# Patient Record
Sex: Female | Born: 1965 | Race: White | Hispanic: No | Marital: Married | Smoking: Former smoker
Health system: Southern US, Community
[De-identification: ages and names within clinical notes are randomized; demographics above are authoritative.]

## PROBLEM LIST (undated history)

## (undated) DIAGNOSIS — D649 Anemia, unspecified: Secondary | ICD-10-CM

---

## 1998-02-26 ENCOUNTER — Other Ambulatory Visit: Admission: RE | Admit: 1998-02-26 | Discharge: 1998-02-26 | Payer: Self-pay | Admitting: Obstetrics and Gynecology

## 1998-07-07 ENCOUNTER — Ambulatory Visit (HOSPITAL_COMMUNITY): Admission: AD | Admit: 1998-07-07 | Discharge: 1998-07-07 | Payer: Self-pay | Admitting: Unknown Physician Specialty

## 1999-03-04 ENCOUNTER — Other Ambulatory Visit: Admission: RE | Admit: 1999-03-04 | Discharge: 1999-03-04 | Payer: Self-pay | Admitting: Obstetrics and Gynecology

## 1999-12-03 ENCOUNTER — Inpatient Hospital Stay (HOSPITAL_COMMUNITY): Admission: AD | Admit: 1999-12-03 | Discharge: 1999-12-03 | Payer: Self-pay | Admitting: Obstetrics and Gynecology

## 1999-12-24 ENCOUNTER — Inpatient Hospital Stay (HOSPITAL_COMMUNITY): Admission: AD | Admit: 1999-12-24 | Discharge: 1999-12-24 | Payer: Self-pay | Admitting: *Deleted

## 1999-12-31 ENCOUNTER — Inpatient Hospital Stay (HOSPITAL_COMMUNITY): Admission: AD | Admit: 1999-12-31 | Discharge: 2000-01-03 | Payer: Self-pay | Admitting: Obstetrics and Gynecology

## 2000-02-10 ENCOUNTER — Other Ambulatory Visit: Admission: RE | Admit: 2000-02-10 | Discharge: 2000-02-10 | Payer: Self-pay | Admitting: Obstetrics and Gynecology

## 2001-05-12 ENCOUNTER — Other Ambulatory Visit: Admission: RE | Admit: 2001-05-12 | Discharge: 2001-05-12 | Payer: Self-pay | Admitting: Obstetrics and Gynecology

## 2002-04-25 ENCOUNTER — Other Ambulatory Visit: Admission: RE | Admit: 2002-04-25 | Discharge: 2002-04-25 | Payer: Self-pay | Admitting: Obstetrics and Gynecology

## 2003-08-24 ENCOUNTER — Other Ambulatory Visit: Admission: RE | Admit: 2003-08-24 | Discharge: 2003-08-24 | Payer: Self-pay | Admitting: Obstetrics and Gynecology

## 2004-10-24 ENCOUNTER — Other Ambulatory Visit: Admission: RE | Admit: 2004-10-24 | Discharge: 2004-10-24 | Payer: Self-pay | Admitting: Obstetrics and Gynecology

## 2006-06-03 ENCOUNTER — Encounter: Admission: RE | Admit: 2006-06-03 | Discharge: 2006-06-03 | Payer: Self-pay | Admitting: Obstetrics and Gynecology

## 2006-06-03 ENCOUNTER — Encounter (INDEPENDENT_AMBULATORY_CARE_PROVIDER_SITE_OTHER): Payer: Self-pay | Admitting: *Deleted

## 2007-02-04 HISTORY — PX: ABDOMINAL HYSTERECTOMY: SHX81

## 2007-07-07 ENCOUNTER — Ambulatory Visit (HOSPITAL_COMMUNITY): Admission: RE | Admit: 2007-07-07 | Discharge: 2007-07-08 | Payer: Self-pay | Admitting: Obstetrics and Gynecology

## 2007-07-07 ENCOUNTER — Encounter (INDEPENDENT_AMBULATORY_CARE_PROVIDER_SITE_OTHER): Payer: Self-pay | Admitting: Obstetrics and Gynecology

## 2010-06-18 NOTE — Discharge Summary (Signed)
[name], [name]                ACCOUNT NO.:  [id number]   MEDICAL RECORD NO.:  [id number]          PATIENT TYPE:  OIB   LOCATION:  9302                          FACILITY:  [facility]   PHYSICIAN:  Duke Salvia. Marcelle Overlie, M.D.DATE OF BIRTH:  [birth year]   DATE OF ADMISSION:  07/07/2007  DATE OF DISCHARGE:                               DISCHARGE SUMMARY   DISCHARGE DIAGNOSES:  1. Pelvic pain, dysmenorrhea.  2. History of pelvic endometriosis.  3. Uterine prolapse.  4. Laparoscopic-assisted vaginal hysterectomy, lysis of adhesions,      left salpingo-oophorectomy this admission.   SUMMARY OF THE HISTORY AND PHYSICAL EXAMINATION:  Please see admission H  and P for details.  Briefly, a 45 year old, G3, P2, A1, 2 previous  cesarean sections who has a history of dysmenorrhea and chronic pelvic  pain, minimal endometriosis by history, and also complains of increased  pelvic pressure consistent with uterine prolapse who presents for  surgical correction.   HOSPITAL COURSE:  On July 07, 2007, under general anesthesia, the patient  underwent LAVH, lysis of adhesions, dense left adnexal adhesions were  noted, and LSO was carried out also.  The right ovary was conserved.  On  the first postoperative day, her hemoglobin was 9.3, WBC 9.9.  She was  afebrile.  Her abdominal exam was unremarkable.  She was tolerating a  regular diet, voiding without difficulty, and ready for discharge at  that point.   LABORATORY DATA:  Preop CBC; WBC 7.7, hemoglobin 12.9, and hematocrit  38.0.  Blood type was O+.  Antibody screen negative.  UPT was negative.  Postop CBC on July 08, 2007; WBC 9.9, hemoglobin 9.3, and hematocrit  26.8.   DISPOSITION:  The patient discharged on vitamins with iron daily, Tylox  p.r.n. pain.  Return to the office in 1 week.  Advised to report any  incisional redness or drainage, fever over 101, increased pain or  voiding difficulties.  She was given specific instructions regarding  diet,  sex, and exercise.   CONDITION:  Good.   ACTIVITY:  Graded increase.      Richard M. Marcelle Overlie, M.D.  Electronically Signed     RMH/MEDQ  D:  07/08/2007  T:  07/08/2007  Job:  [id number]

## 2010-06-18 NOTE — H&P (Signed)
NAMELAVANDA, [name]                ACCOUNT NO.:  [id number]   MEDICAL RECORD NO.:  [id number]          PATIENT TYPE:  AMB   LOCATION:  SDC                           [facility]  [facility]   PHYSICIAN:  Duke Salvia. Marcelle Overlie, M.D.DATE OF BIRTH:  05/17/1965   DATE OF ADMISSION:  07/07/2007  DATE OF DISCHARGE:                              HISTORY & PHYSICAL   CHIEF COMPLAINT:  Chronic pelvic pain, dysmenorrhea, uterine prolapse  symptoms.   HISTORY OF PRESENT ILLNESS:  A 45 year old G3, P2, A1.  She has had two  previous cesarean sections, currently using condoms for contraception.  She has a 4-5-year history of dysmenorrhea and chronic pelvic pain, in  the early 1990s had a laparoscopy and was told she had minimal  endometriosis.  That was not done here in [place].  Since that time  she has had two pregnancies at term delivered by cesarean section but  has continued to be bothered by dysmenorrhea, chronic pelvic pain that  has been relieved somewhat with OCPs.  She is currently not on OCPs and  more recently in the last 6 months has noted increased pressure with a  sensation that something is hanging out, consistent with uterine  prolapse on examination.  Due to these reasons she presents now for a  definitive hysterectomy.  Procedure of LAVH reviewed in detail with her.  Specifically, risks related to bleeding, infection, transfusion,  adjacent organ injury all discussed.  Also discussed the possibility of  the need for open surgery depending on pelvic pathology.  Her preference  is to conserve both ovaries if normal.  Expected recovery time along  with other risks related to wound infection and phlebitis discussed with  her, which she understands and accepts.   PAST MEDICAL HISTORY:  Allergies:  None.   Current medications:  None.   Surgical history:  1. Cesarean section x2.  2. Liposuction.  3. Laparoscopy.  4. Benign breast biopsy.   FAMILY HISTORY:  Significant for family history  of colon cancer.  She  has had a screening colonoscopy within the last several years that was  normal.   PHYSICAL EXAM:  Temperature 98.2, blood pressure 120/78.  HEENT: Unremarkable.  NECK:  Supple without masses.  LUNGS:  Clear.  CARDIOVASCULAR:  Regular rate and rhythm without murmurs, rubs or  gallops noted.  BREASTS:  Without masses.  ABDOMEN:  Soft, flat and nontender.  PELVIC:  Normal external genitalia.  Vagina and cervix clear.  Uterus  midposition, normal size.  On straining there is a mild to moderate  prolapse.  Her anterior and posterior support is good.  Adnexa  unremarkable.  No unusual nodularity or tenderness.  EXTREMITIES:  Unremarkable.  NEUROLOGIC:  Unremarkable.   IMPRESSION:  1. One symptomatic uterine prolapse.  2. History of mild endometriosis.  3. Dysmenorrhea, chronic pelvic pain.   PLAN:  LAVH.  Procedure and risks reviewed as above up.      Richard M. Marcelle Overlie, M.D.  Electronically Signed     RMH/MEDQ  D:  06/24/2007  T:  06/24/2007  Job:  [id number]

## 2010-06-18 NOTE — Op Note (Signed)
[name], [name]                ACCOUNT NO.:  [id number]   MEDICAL RECORD NO.:  [id number]          PATIENT TYPE:  OIB   LOCATION:  9302                          [facility]ILITY:  [facility]   PHYSICIAN:  Duke Salvia. Marcelle Overlie, M.D.DATE OF BIRTH:  [birth year]   DATE OF PROCEDURE:  07/07/2007  DATE OF DISCHARGE:                               OPERATIVE REPORT   PREOPERATIVE DIAGNOSES:  Chronic pelvic pain and dysmenorrhea.   POSTOPERATIVE DIAGNOSIS:  Chronic pelvic pain and dysmenorrhea plus  significant pelvic adhesions.   PROCEDURES:  Laparoscopic-assisted vaginal hysteroscopy, left salpingo-  oophorectomy, and lysis of adhesions.   SURGEON:  Duke Salvia. Marcelle Overlie, MD   ASSISTANT:  Dineen Kid. Rana Snare, MD   ANESTHESIA:  General endotracheal.   SPECIMENS REMOVED:  Uterus and left tube and ovary including left  ovarian fibroma.   ESTIMATED BLOOD LOSS:  300 mL.   PROCEDURE AND FINDINGS:  The patient was taken to the operating room  after an adequate level of general anesthesia was obtained with the  patient's legs in stirrups.  The abdomen, perineum, and vagina were  prepped and draped in usual manner for laparoscopy.  Hulka tenaculum was  positioned.  Foley catheter was positioned draining clear urine.  The  subumbilical area was infiltrated with 0.25% Marcaine plain.  A small  incision was made.  The Veress needle was introduced without difficulty.  Its intra-abdominal position was verified by pressure and water testing.  After 2.5 liter, pneumoperitoneum was then created.  Laparoscopic trocar  and sleeve were then introduced without difficulty.  Three  fingerbreadths above the symphysis in the midline, a 5-mm trocar was  inserted under direct visualization.  Pelvic findings as follows:   The uterus itself was normal size.  Right tube and ovary were normal.  There were dense adhesions from the left lateral anterior uterine wall  at the bladder reflection to the anterior abdominal wall.  There  was  some omentum stuck into that area also.  The left ovary had a 2-cm  apparent ovarian fibroma on 1 pole and was on traction into the area of  the round ligament.  Starting with the omental adhesions, these areas  were lysed in an avascular plane, which assisted in better  visualization.  The right utero-ovarian pedicle was then coagulated and  divided down to and including the round ligament with excellent  hemostasis.  On the opposite side, the dense band of adhesions were  lysed close to the uterus with the Gyrus PK instrument down to the level  of the bladder reflection.  The left utero-ovarian pedicle was then  coagulated and divided with the PK instrument.  Initially, I was going  to attempt to remove the fibroma vaginally and conserved the left ovary,  so the vaginal portion procedure started at this stage.   Weighted speculum was positioned.  Her legs were extended.  The cervical  vaginal mucosa was incised.  Posterior culdotomy was performed without  difficulty.  Bladder was advanced superiorly with sharp and blunt  dissection using the handheld Gyrus PK instrument.  The uterosacral  ligament, cardinal  ligament, and uterine vasculature pedicles were  coagulated and divided on each side.  There was some significant  scarring anteriorly and the surgeon's finger was used then used  posteriorly to isolate the peritoneal reflection and this was entered  sharply.  Retractor was then used to gently elevate the bladder out of  the field for the remainder of the case.  The remaining pedicles were  then coagulated and divided with the handheld instrument.  Specimen was  thus removed.  I could not isolate the left ovary and was on too much  traction to be pulled down to the vagina.  The McCall culdoplasty was  placed from left uterosacral ligament, picking up posterior peritoneum  across to the right uterosacral ligament which was tied down for extra  posterior support.  The cuff was  then closed with a 2-0 Vicryl locked  suture.  Prior to closure, sponge, needle, and instrument counts were  reported as correct x2.  The vaginal mucosa was closed right-to-left  with 2-0 Monocryl sutures.  Foley catheter was repositioned draining  clear urine at that point.  Repeat laparoscopy was carried out with a  NaSal for irrigation.  The area of the cuff was on the opposite site was  hemostatic even at reduced pressure.   The left ovary, however, was tented up into the round ligament and  appeared to be in a good size and appeared to be problematic as far as  causing pain later.  I did not feel this could be freed up.  Decision  was made to proceed with LSO.  The ovary was placed on traction toward  the midline.  The left IP ligament was visualized.  The course of the  ureter was inspected first and noted to be well below, staying close to  the ovary.  The infundibulopelvic ligament was coagulated and divided.  The left tube and ovary was freed out.  Re-inspection of the ureter  course revealed it to be well below.  A 5-mm trocar was inserted in the  left lower quadrant and the fascia was enlarged slightly to allow  removal of the ovary and the fibroma.  After this was completed, copious  irrigation was carried out and inspection revealed the operative sites  be hemostatic.  Instruments were removed.  Gas was allowed to escape.  The left lower quadrant incision fascia was closed with a 2-0 Vicryl  running suture and 4-0 Vicryl subcuticular.  The subumbilical was closed  with a 4-0 Vicryl subcuticular, the remaining incision with Dermabond.  She tolerated this well and went to recovery room in good condition.   ADDENDUM:  The bladder was backfilled under pressure with sterile milk  prior to closure and was noted to be intact.      Richard M. Marcelle Overlie, M.D.  Electronically Signed     RMH/MEDQ  D:  07/07/2007  T:  07/08/2007  Job:  [id number]

## 2010-06-21 NOTE — Discharge Summary (Signed)
[facility]  Patient:    [name], [name]                         MRN: [id number] Adm. Date:  [id number] Disc. Date: 01/03/00 Attending:  Rhina Brackett Dictator:   Danie Chandler, R.N.                           Discharge Summary  ADMISSION DIAGNOSES:          1. Term intrauterine pregnancy.                               2. Previous cesarean section.                               3. Declines vaginal birth after cesarean                                  section.  DISCHARGE DIAGNOSES:          1. Term intrauterine pregnancy.                               2. Previous cesarean section.                               3. Declines vaginal birth after cesarean                                  section.  PROCEDURES:                   On December 31, 1999, repeat low transverse cesarean section.  REASON FOR ADMISSION:         Please see the dictated H&P.  HOSPITAL COURSE:              The patient was taken to the operating room and underwent the above-named procedure without complication.  This was productive of a viable female infant with Apgars of 9 at one minute and 9 at five minutes. Postoperatively on day #1, the patient had a good return of bowel function and was tolerating a regular diet.  She also had good pain control and was ambulating well without difficulty.  Her hemoglobin on this day was 10.7, hematocrit 30.5, and white blood cell count 10.5.  On postoperative day #2, the patient was stable and she was discharged home on postoperative day #3.  CONDITION ON DISCHARGE:       Good.  DIET:                         Regular as tolerated.  ACTIVITY:                     No heavy lifting.  No driving.  No vaginal entry.  FOLLOW-UP:                    She is to follow up in the office in one to two weeks for incision check.  She is  to call for temperature greater than 100 degrees, persistent nausea, vomiting, heavy vaginal bleeding, and/or redness  from the incision site.  DISCHARGE MEDICATIONS:        1. Prenatal vitamins one p.o. q.d.                               2. Percocet 5 mg as directed by M.D. for pain. DD:  01/03/00 TD:  01/03/00 Job: [id number]

## 2010-06-21 NOTE — Op Note (Signed)
[facility]  Patient:    [name], [name]                         MRN: [id number] Proc. Date: 12/31/99 Adm. Date:  [id number] Attending:  Rhina Brackett                           Operative Report  PREOPERATIVE DIAGNOSES:       1. Term intrauterine pregnancy.                               2. Previous cesarean section.                               3. Declines vaginal birth after cesarean.  POSTOPERATIVE DIAGNOSES:      1. Term intrauterine pregnancy.                               2. Previous cesarean section.                               3. Declines vaginal birth after cesarean.  OPERATION:                    Repeat low transverse cesarean section.  SURGEON:                      Duke Salvia. Marcelle Overlie, M.D.  ASSISTANT:  ANESTHESIA:                   Spinal anesthesia.  COMPLICATIONS:                None.  DRAINS:                       Foley catheter.  ESTIMATED BLOOD LOSS:         1000 cc.  DESCRIPTION OF PROCEDURE:     The patient was taken to the operating room and after an adequate level of spinal anesthetic was obtained with the patient in the left tilt position, the abdomen was prepped and draped in the usual manner for sterile abdominal procedures.  Foley catheter positioned draining clear urine.  Attention was directed to the abdomen where the prior skin scar was incised in an ellipse, carried down to the fascia which was incised and extended transversely.  There was a moderate amount of dense scar tissue noted in the fascial and subfascial layers.  The rectus muscles were separated from the fascia, divided in the midline superiorly and the peritoneum entered without difficulty.  There was significant bladder flap scarring noted. With careful dissection, the rectus muscles were partially divided and the peritoneal incision was enlarged for better visibility.  The bladder blade was positioned and the bladder flap was developed with  sharp and blunt dissection without difficulty.  Incision was made in the lower segment which was extended with blunt dissection. Clear fluid noted.  The patient delivered of a 9 pound 6 ounce female with Apgars 9 and 9. The infant was suctioned, cord clamped and passed to the pediatric team for further care. A tender touch VE was used to easily deliver  the vertex and the remainder of the fetus delivered easily. Placenta then delivered manually intact.  The uterus exteriorized. The cavity wiped clean with a laparotomy pack.  Closure obtained with the first layer of 0 chromic in locked fashion followed by an imbricating layer of 0 chromic. Tubes and ovaries were inspected and noted to be normal.  The bladder area was inspected carefully and noted to be intact and hemostatic.  Prior to closure, sponge, needle and instrument counts were reported as correct x 2.  Rectus muscles reapproximated in the lower portion at least with 2-0 Dexon interrupted sutures.  Fascia closed from laterally to midline on either side with 0 Dexon running suture.  Subcutaneous fat was undermined to allow better closure of the skin without tension. This area was made hemostatic with the Bovie, irrigated and reinspected and noted to be hemostatic.  Skin was approximated with clips and Steri-Strips.  Pressure dressing was applied.  She did receive Pitocin IV and Ancef 1 g IV after the cord was clamped. DD:  12/31/99 TD:  12/31/99 Job: [id number]

## 2010-10-31 LAB — TYPE AND SCREEN: Antibody Screen: NEGATIVE

## 2010-10-31 LAB — CBC
Hemoglobin: 12.9
MCHC: 34.6
MCV: 86.9
Platelets: 209
Platelets: 292
RDW: 13.5
RDW: 13.9
WBC: 7.7

## 2010-10-31 LAB — PREGNANCY, URINE

## 2012-03-23 ENCOUNTER — Other Ambulatory Visit: Payer: Self-pay | Admitting: Orthopedic Surgery

## 2012-04-01 ENCOUNTER — Encounter (HOSPITAL_COMMUNITY): Payer: Self-pay | Admitting: Pharmacy Technician

## 2012-04-05 ENCOUNTER — Inpatient Hospital Stay (HOSPITAL_COMMUNITY): Admission: RE | Admit: 2012-04-05 | Payer: Self-pay | Source: Ambulatory Visit

## 2012-04-05 ENCOUNTER — Other Ambulatory Visit (HOSPITAL_COMMUNITY): Payer: Self-pay | Admitting: Specialist

## 2012-04-05 NOTE — Patient Instructions (Addendum)
20 [name] [id number]  04/05/2012   Your procedure is scheduled on: 04-14-2012  Report to [facility] at 0630 AM.  Call this number if you have problems the morning of surgery [phone]   Remember:   Do not eat food or drink liquids :After Midnight.     Take these medicines the morning of surgery with A SIP OF WATER: no meds to take                                SEE [place] PREPARING FOR SURGERY SHEET   Do not wear jewelry, make-up or nail polish.  Do not wear lotions, powders, or perfumes. You may wear deodorant.   Men may shave face and neck.  Do not bring valuables to the hospital.  Contacts, dentures or bridgework may not be worn into surgery.  Leave suitcase in the car. After surgery it may be brought to your room.  For patients admitted to the hospital, checkout time is 11:00 AM the day of discharge.   Patients discharged the day of surgery will not be allowed to drive home.  Name and phone number of your driver:  Special Instructions: N/A   Please read over the following fact sheets that you were given: MRSA Information.  Call Cain Sieve RN pre op nurse if needed [phone]    FAILURE TO FOLLOW THESE INSTRUCTIONS MAY RESULT IN THE CANCELLATION OF YOUR SURGERY. PATIENT SIGNATURE___________________________________________

## 2012-04-06 ENCOUNTER — Other Ambulatory Visit: Payer: Self-pay | Admitting: Orthopedic Surgery

## 2012-04-06 ENCOUNTER — Encounter (HOSPITAL_COMMUNITY): Payer: Self-pay

## 2012-04-06 ENCOUNTER — Ambulatory Visit (HOSPITAL_COMMUNITY)
Admission: RE | Admit: 2012-04-06 | Discharge: 2012-04-06 | Disposition: A | Discharge status: Home / Self Care | Payer: 59 | Source: Ambulatory Visit | Attending: Orthopedic Surgery | Admitting: Orthopedic Surgery

## 2012-04-06 ENCOUNTER — Encounter (HOSPITAL_COMMUNITY)
Admission: RE | Admit: 2012-04-06 | Discharge: 2012-04-06 | Disposition: A | Discharge status: Home / Self Care | Payer: 59 | Source: Ambulatory Visit | Attending: Specialist | Admitting: Specialist

## 2012-04-06 DIAGNOSIS — M545 Low back pain, unspecified: Secondary | ICD-10-CM | POA: Insufficient documentation

## 2012-04-06 DIAGNOSIS — Z01812 Encounter for preprocedural laboratory examination: Secondary | ICD-10-CM | POA: Insufficient documentation

## 2012-04-06 DIAGNOSIS — Z01818 Encounter for other preprocedural examination: Secondary | ICD-10-CM | POA: Insufficient documentation

## 2012-04-06 HISTORY — DX: Anemia, unspecified: D64.9

## 2012-04-06 LAB — CBC
HCT: 40.2 % (ref 36.0–46.0)
Hemoglobin: 14 g/dL (ref 12.0–15.0)
MCH: 30.8 pg (ref 26.0–34.0)
MCHC: 34.8 g/dL (ref 30.0–36.0)
MCV: 88.5 fL (ref 78.0–100.0)
Platelets: 249 K/uL (ref 150–400)
RBC: 4.54 MIL/uL (ref 3.87–5.11)
RDW: 12.6 % (ref 11.5–15.5)
WBC: 6.4 K/uL (ref 4.0–10.5)

## 2012-04-06 LAB — BASIC METABOLIC PANEL
CO2: 25 mEq/L (ref 19–32)
Calcium: 9.2 mg/dL (ref 8.4–10.5)
Creatinine, Ser: 0.71 mg/dL (ref 0.50–1.10)
GFR calc Af Amer: >90 mL/min (ref >90)

## 2012-04-06 LAB — SURGICAL PCR SCREEN
MRSA, PCR: NEGATIVE
Staphylococcus aureus: NEGATIVE

## 2012-04-06 NOTE — Progress Notes (Signed)
Spoke with pt by phone aware surgery date changed to 04-12-12, no food after midnight clear liquids midnight until 0835 am day of surgery, then nothing by mouth after 0835 am. Arrive 1205 pm 04-12-12 [facility] short stay

## 2012-04-12 ENCOUNTER — Ambulatory Visit (HOSPITAL_COMMUNITY): Payer: 59

## 2012-04-12 ENCOUNTER — Encounter (HOSPITAL_COMMUNITY)
Admission: RE | Disposition: A | Discharge status: Home / Self Care | Payer: Self-pay | Source: Ambulatory Visit | Attending: Specialist

## 2012-04-12 ENCOUNTER — Observation Stay (HOSPITAL_COMMUNITY)
Admission: RE | Admit: 2012-04-12 | Discharge: 2012-04-13 | Disposition: A | Discharge status: Home / Self Care | Payer: 59 | Source: Ambulatory Visit | Attending: Specialist | Admitting: Specialist

## 2012-04-12 ENCOUNTER — Encounter (HOSPITAL_COMMUNITY): Payer: Self-pay | Admitting: *Deleted

## 2012-04-12 ENCOUNTER — Encounter (HOSPITAL_COMMUNITY): Payer: Self-pay | Admitting: Anesthesiology

## 2012-04-12 ENCOUNTER — Ambulatory Visit (HOSPITAL_COMMUNITY): Payer: 59 | Admitting: Anesthesiology

## 2012-04-12 DIAGNOSIS — M5126 Other intervertebral disc displacement, lumbar region: Principal | ICD-10-CM | POA: Insufficient documentation

## 2012-04-12 HISTORY — PX: LUMBAR LAMINECTOMY/DECOMPRESSION MICRODISCECTOMY: SHX5026

## 2012-04-12 SURGERY — LUMBAR LAMINECTOMY/DECOMPRESSION MICRODISCECTOMY 1 LEVEL
Anesthesia: General | Site: Back | Wound class: Clean

## 2012-04-12 MED ORDER — DOCUSATE SODIUM 100 MG PO CAPS
100.0000 mg | ORAL_CAPSULE | Freq: Two times a day (BID) | ORAL | Status: DC
  Administered 2012-04-13: 100 mg via ORAL

## 2012-04-12 MED ORDER — LACTATED RINGERS IV SOLN: INTRAVENOUS | Status: DC

## 2012-04-12 MED ORDER — THROMBIN 5000 UNITS EX SOLR
CUTANEOUS | Status: DC | PRN
  Administered 2012-04-12: 10000 [IU] via TOPICAL

## 2012-04-12 MED ORDER — DIPHENHYDRAMINE HCL 50 MG/ML IJ SOLN
INTRAMUSCULAR | Status: AC
  Filled 2012-04-12: qty 1

## 2012-04-12 MED ORDER — METHOCARBAMOL 100 MG/ML IJ SOLN: 500.0000 mg | Freq: Four times a day (QID) | INTRAVENOUS | Status: DC | PRN

## 2012-04-12 MED ORDER — SENNOSIDES-DOCUSATE SODIUM 8.6-50 MG PO TABS
1.0000 | ORAL_TABLET | Freq: Every evening | ORAL | Status: DC | PRN
  Filled 2012-04-12: qty 1

## 2012-04-12 MED ORDER — SODIUM CHLORIDE 0.9 % IR SOLN
Status: DC | PRN
  Administered 2012-04-12: 16:00:00

## 2012-04-12 MED ORDER — SODIUM CHLORIDE 0.9 % IJ SOLN
3.0000 mL | INTRAMUSCULAR | Status: DC | PRN
  Administered 2012-04-12: 3 mL via INTRAVENOUS

## 2012-04-12 MED ORDER — MENTHOL 3 MG MT LOZG: 1.0000 | LOZENGE | OROMUCOSAL | Status: DC | PRN

## 2012-04-12 MED ORDER — SODIUM CHLORIDE 0.9 % IV SOLN
INTRAVENOUS | Status: DC
  Administered 2012-04-12: 20:00:00 via INTRAVENOUS

## 2012-04-12 MED ORDER — ACETAMINOPHEN 650 MG RE SUPP: 650.0000 mg | RECTAL | Status: DC | PRN

## 2012-04-12 MED ORDER — DIPHENHYDRAMINE HCL 50 MG/ML IJ SOLN
12.5000 mg | Freq: Once | INTRAMUSCULAR | Status: AC
  Administered 2012-04-12: 12.5 mg via INTRAVENOUS

## 2012-04-12 MED ORDER — CEFAZOLIN SODIUM-DEXTROSE 2-3 GM-% IV SOLR
2.0000 g | Freq: Three times a day (TID) | INTRAVENOUS | Status: AC
  Administered 2012-04-12 – 2012-04-13 (×2): 2 g via INTRAVENOUS
  Filled 2012-04-12 (×2): qty 50

## 2012-04-12 MED ORDER — CEFAZOLIN SODIUM-DEXTROSE 2-3 GM-% IV SOLR
INTRAVENOUS | Status: AC
  Filled 2012-04-12: qty 50

## 2012-04-12 MED ORDER — SODIUM CHLORIDE 0.9 % IV SOLN: 250.0000 mL | INTRAVENOUS | Status: DC

## 2012-04-12 MED ORDER — SODIUM CHLORIDE 0.9 % IJ SOLN
3.0000 mL | Freq: Two times a day (BID) | INTRAMUSCULAR | Status: DC
  Administered 2012-04-12: 3 mL via INTRAVENOUS

## 2012-04-12 MED ORDER — BISACODYL 5 MG PO TBEC: 5.0000 mg | DELAYED_RELEASE_TABLET | Freq: Every day | ORAL | Status: DC | PRN

## 2012-04-12 MED ORDER — FLEET ENEMA 7-19 GM/118ML RE ENEM: 1.0000 | ENEMA | Freq: Once | RECTAL | Status: AC | PRN

## 2012-04-12 MED ORDER — LACTATED RINGERS IV SOLN
INTRAVENOUS | Status: DC | PRN
  Administered 2012-04-12 (×2): via INTRAVENOUS

## 2012-04-12 MED ORDER — METHOCARBAMOL 500 MG PO TABS: 500.0000 mg | ORAL_TABLET | Freq: Four times a day (QID) | ORAL | Status: AC

## 2012-04-12 MED ORDER — MIDAZOLAM HCL 5 MG/5ML IJ SOLN: INTRAMUSCULAR | Status: DC | PRN

## 2012-04-12 MED ORDER — MIDAZOLAM HCL 5 MG/5ML IJ SOLN
INTRAMUSCULAR | Status: DC | PRN
  Administered 2012-04-12: 1 mg via INTRAVENOUS

## 2012-04-12 MED ORDER — HYDROMORPHONE HCL PF 1 MG/ML IJ SOLN
0.2500 mg | INTRAMUSCULAR | Status: DC | PRN
  Administered 2012-04-12: 0.5 mg via INTRAVENOUS

## 2012-04-12 MED ORDER — NEOSTIGMINE METHYLSULFATE 1 MG/ML IJ SOLN
INTRAMUSCULAR | Status: DC | PRN
  Administered 2012-04-12: 3 mg via INTRAVENOUS

## 2012-04-12 MED ORDER — PROMETHAZINE HCL 25 MG/ML IJ SOLN: 6.2500 mg | INTRAMUSCULAR | Status: DC | PRN

## 2012-04-12 MED ORDER — ONDANSETRON HCL 4 MG/2ML IJ SOLN: 4.0000 mg | INTRAMUSCULAR | Status: DC | PRN

## 2012-04-12 MED ORDER — MORPHINE SULFATE 2 MG/ML IJ SOLN
1.0000 mg | INTRAMUSCULAR | Status: DC | PRN
  Administered 2012-04-12 – 2012-04-13 (×2): 2 mg via INTRAVENOUS
  Filled 2012-04-12 (×2): qty 1

## 2012-04-12 MED ORDER — HYDROMORPHONE HCL PF 1 MG/ML IJ SOLN
INTRAMUSCULAR | Status: DC | PRN
  Administered 2012-04-12 (×4): 0.5 mg via INTRAVENOUS

## 2012-04-12 MED ORDER — ONDANSETRON HCL 4 MG/2ML IJ SOLN
INTRAMUSCULAR | Status: DC | PRN
  Administered 2012-04-12 (×2): 2 mg via INTRAVENOUS

## 2012-04-12 MED ORDER — ACETAMINOPHEN 10 MG/ML IV SOLN
INTRAVENOUS | Status: AC
  Filled 2012-04-12: qty 100

## 2012-04-12 MED ORDER — GLYCOPYRROLATE 0.2 MG/ML IJ SOLN
INTRAMUSCULAR | Status: DC | PRN
  Administered 2012-04-12: .4 mg via INTRAVENOUS

## 2012-04-12 MED ORDER — ALUM & MAG HYDROXIDE-SIMETH 200-200-20 MG/5ML PO SUSP: 30.0000 mL | Freq: Four times a day (QID) | ORAL | Status: DC | PRN

## 2012-04-12 MED ORDER — THROMBIN 5000 UNITS EX SOLR
CUTANEOUS | Status: AC
  Filled 2012-04-12: qty 5000

## 2012-04-12 MED ORDER — KETAMINE HCL 10 MG/ML IJ SOLN
INTRAMUSCULAR | Status: DC | PRN
  Administered 2012-04-12 (×4): 2 mg via INTRAVENOUS

## 2012-04-12 MED ORDER — ACETAMINOPHEN 325 MG PO TABS: 650.0000 mg | ORAL_TABLET | ORAL | Status: DC | PRN

## 2012-04-12 MED ORDER — BUPIVACAINE-EPINEPHRINE 0.5% -1:200000 IJ SOLN
INTRAMUSCULAR | Status: DC | PRN
  Administered 2012-04-12: 10 mL

## 2012-04-12 MED ORDER — ACETAMINOPHEN 10 MG/ML IV SOLN
INTRAVENOUS | Status: DC | PRN
  Administered 2012-04-12: 1000 mg via INTRAVENOUS

## 2012-04-12 MED ORDER — PHENOL 1.4 % MT LIQD: 1.0000 | OROMUCOSAL | Status: DC | PRN

## 2012-04-12 MED ORDER — CISATRACURIUM BESYLATE (PF) 10 MG/5ML IV SOLN
INTRAVENOUS | Status: DC | PRN
  Administered 2012-04-12: 2 mg via INTRAVENOUS
  Administered 2012-04-12 (×3): 1 mg via INTRAVENOUS
  Administered 2012-04-12: 5 mg via INTRAVENOUS

## 2012-04-12 MED ORDER — BUPIVACAINE-EPINEPHRINE (PF) 0.5% -1:200000 IJ SOLN
INTRAMUSCULAR | Status: AC
  Filled 2012-04-12: qty 10

## 2012-04-12 MED ORDER — ZOLPIDEM TARTRATE 5 MG PO TABS
5.0000 mg | ORAL_TABLET | Freq: Every evening | ORAL | Status: DC | PRN
  Administered 2012-04-12: 5 mg via ORAL
  Filled 2012-04-12: qty 1

## 2012-04-12 MED ORDER — LACTATED RINGERS IV SOLN
INTRAVENOUS | Status: DC
  Administered 2012-04-12 (×2): 1000 mL via INTRAVENOUS

## 2012-04-12 MED ORDER — CEFAZOLIN SODIUM-DEXTROSE 2-3 GM-% IV SOLR
2.0000 g | INTRAVENOUS | Status: AC
  Administered 2012-04-12: 2 g via INTRAVENOUS

## 2012-04-12 MED ORDER — HYDROCODONE-ACETAMINOPHEN 5-325 MG PO TABS
1.0000 | ORAL_TABLET | ORAL | Status: DC | PRN
  Administered 2012-04-13: 2 via ORAL
  Filled 2012-04-12: qty 2

## 2012-04-12 MED ORDER — FLUCONAZOLE IN SODIUM CHLORIDE 200-0.9 MG/100ML-% IV SOLN
200.0000 mg | Freq: Once | INTRAVENOUS | Status: AC
  Administered 2012-04-12: 200 mg via INTRAVENOUS
  Filled 2012-04-12: qty 100

## 2012-04-12 MED ORDER — OXYCODONE-ACETAMINOPHEN 5-325 MG PO TABS: 1.0000 | ORAL_TABLET | ORAL | Status: DC | PRN

## 2012-04-12 MED ORDER — LIDOCAINE HCL (CARDIAC) 20 MG/ML IV SOLN
INTRAVENOUS | Status: DC | PRN
  Administered 2012-04-12: 30 mg via INTRAVENOUS

## 2012-04-12 MED ORDER — FENTANYL CITRATE 0.05 MG/ML IJ SOLN
INTRAMUSCULAR | Status: DC | PRN
  Administered 2012-04-12 (×2): 50 ug via INTRAVENOUS

## 2012-04-12 MED ORDER — PROPOFOL 10 MG/ML IV EMUL
INTRAVENOUS | Status: DC | PRN
  Administered 2012-04-12: 150 mg via INTRAVENOUS

## 2012-04-12 MED ORDER — CHLORHEXIDINE GLUCONATE 4 % EX LIQD: 60.0000 mL | Freq: Once | CUTANEOUS | Status: DC

## 2012-04-12 MED ORDER — DEXAMETHASONE SODIUM PHOSPHATE 4 MG/ML IJ SOLN
INTRAMUSCULAR | Status: DC | PRN
  Administered 2012-04-12: 10 mg via INTRAVENOUS

## 2012-04-12 MED ORDER — SUCCINYLCHOLINE CHLORIDE 20 MG/ML IJ SOLN
INTRAMUSCULAR | Status: DC | PRN
  Administered 2012-04-12: 100 mg via INTRAVENOUS

## 2012-04-12 MED ORDER — OXYCODONE-ACETAMINOPHEN 7.5-325 MG PO TABS: 1.0000 | ORAL_TABLET | ORAL | Status: DC | PRN

## 2012-04-12 MED ORDER — METHOCARBAMOL 500 MG PO TABS: 500.0000 mg | ORAL_TABLET | Freq: Four times a day (QID) | ORAL | Status: DC | PRN

## 2012-04-12 MED ORDER — HYDROMORPHONE HCL PF 1 MG/ML IJ SOLN
INTRAMUSCULAR | Status: AC
  Filled 2012-04-12: qty 1

## 2012-04-12 MED ORDER — ESTRADIOL 0.5 MG/0.5GM TD GEL
0.5000 mg | Freq: Every day | TRANSDERMAL | Status: DC
  Administered 2012-04-12: 0.5 mg via TRANSDERMAL

## 2012-04-12 SURGICAL SUPPLY — 52 items
APL SKNCLS STERI-STRIP NONHPOA (GAUZE/BANDAGES/DRESSINGS)
BAG SPEC THK2 15X12 ZIP CLS (MISCELLANEOUS) ×1
BAG ZIPLOCK 12X15 (MISCELLANEOUS) ×2 IMPLANT
BENZOIN TINCTURE PRP APPL 2/3 (GAUZE/BANDAGES/DRESSINGS) ×2 IMPLANT
CHLORAPREP W/TINT 26ML (MISCELLANEOUS) IMPLANT
CLEANER TIP ELECTROSURG 2X2 (MISCELLANEOUS) ×2 IMPLANT
CLOTH BEACON ORANGE TIMEOUT ST (SAFETY) ×2 IMPLANT
DECANTER SPIKE VIAL GLASS SM (MISCELLANEOUS) ×1 IMPLANT
DRAPE MICROSCOPE LEICA (MISCELLANEOUS) ×2 IMPLANT
DRAPE POUCH INSTRU U-SHP 10X18 (DRAPES) ×2 IMPLANT
DRAPE SURG 17X11 SM STRL (DRAPES) ×2 IMPLANT
DRSG AQUACEL AG ADV 3.5X 4 (GAUZE/BANDAGES/DRESSINGS) ×2 IMPLANT
DRSG EMULSION OIL 3X3 NADH (GAUZE/BANDAGES/DRESSINGS) IMPLANT
DRSG PAD ABDOMINAL 8X10 ST (GAUZE/BANDAGES/DRESSINGS) IMPLANT
DRSG TELFA 4X5 ISLAND ADH (GAUZE/BANDAGES/DRESSINGS) IMPLANT
DURAPREP 26ML APPLICATOR (WOUND CARE) ×2 IMPLANT
ELECT REM PT RETURN 9FT ADLT (ELECTROSURGICAL) ×2
ELECTRODE REM PT RTRN 9FT ADLT (ELECTROSURGICAL) ×1 IMPLANT
GLOVE BIOGEL PI IND STRL 7.5 (GLOVE) ×1 IMPLANT
GLOVE BIOGEL PI IND STRL 8 (GLOVE) ×1 IMPLANT
GLOVE BIOGEL PI INDICATOR 7.5 (GLOVE) ×1
GLOVE BIOGEL PI INDICATOR 8 (GLOVE) ×1
GLOVE SURG SS PI 7.5 STRL IVOR (GLOVE) ×2 IMPLANT
GLOVE SURG SS PI 8.0 STRL IVOR (GLOVE) ×4 IMPLANT
GOWN PREVENTION PLUS LG XLONG (DISPOSABLE) ×2 IMPLANT
GOWN STRL REIN XL XLG (GOWN DISPOSABLE) ×4 IMPLANT
KIT BASIN OR (CUSTOM PROCEDURE TRAY) ×2 IMPLANT
KIT POSITIONING SURG ANDREWS (MISCELLANEOUS) ×2 IMPLANT
MANIFOLD NEPTUNE II (INSTRUMENTS) ×2 IMPLANT
NDL SPNL 18GX3.5 QUINCKE PK (NEEDLE) ×3 IMPLANT
NEEDLE SPNL 18GX3.5 QUINCKE PK (NEEDLE) ×6 IMPLANT
PATTIES SURGICAL .5 X.5 (GAUZE/BANDAGES/DRESSINGS) IMPLANT
PATTIES SURGICAL .75X.75 (GAUZE/BANDAGES/DRESSINGS) IMPLANT
PATTIES SURGICAL 1X1 (DISPOSABLE) IMPLANT
SPONGE SURGIFOAM ABS GEL 100 (HEMOSTASIS) ×2 IMPLANT
STAPLER VISISTAT (STAPLE) IMPLANT
STRIP CLOSURE SKIN 1/2X4 (GAUZE/BANDAGES/DRESSINGS) IMPLANT
SUT PROLENE 3 0 PS 2 (SUTURE) IMPLANT
SUT VIC AB 0 CT1 27 (SUTURE)
SUT VIC AB 0 CT1 27XBRD ANTBC (SUTURE) IMPLANT
SUT VIC AB 1 CT1 27 (SUTURE) ×2
SUT VIC AB 1 CT1 27XBRD ANTBC (SUTURE) ×1 IMPLANT
SUT VIC AB 1-0 CT2 27 (SUTURE) IMPLANT
SUT VIC AB 2-0 CT1 27 (SUTURE) ×2
SUT VIC AB 2-0 CT1 TAPERPNT 27 (SUTURE) ×1 IMPLANT
SUT VIC AB 2-0 CT2 27 (SUTURE) ×4 IMPLANT
SUT VICRYL 0 27 CT2 27 ABS (SUTURE) ×4 IMPLANT
SUT VICRYL 0 UR6 27IN ABS (SUTURE) IMPLANT
SYRINGE 10CC LL (SYRINGE) ×4 IMPLANT
TAPE CLOTH SURG 4X10 WHT LF (GAUZE/BANDAGES/DRESSINGS) ×1 IMPLANT
TRAY LAMINECTOMY (CUSTOM PROCEDURE TRAY) ×2 IMPLANT
YANKAUER SUCT BULB TIP NO VENT (SUCTIONS) ×2 IMPLANT

## 2012-04-12 NOTE — Anesthesia Preprocedure Evaluation (Signed)
Anesthesia Evaluation  Patient identified by MRN, date of birth, ID band Patient awake    Reviewed: Allergy & Precautions, H&P , NPO status , Patient's Chart, lab work & pertinent test results  Airway Mallampati: II TM Distance: >3 FB Neck ROM: Full    Dental  (+) Teeth Intact and Dental Advisory Given   Pulmonary neg pulmonary ROS,  breath sounds clear to auscultation  Pulmonary exam normal       Cardiovascular negative cardio ROS  Rhythm:Regular Rate:Normal     Neuro/Psych negative neurological ROS  negative psych ROS   GI/Hepatic negative GI ROS, Neg liver ROS,   Endo/Other  negative endocrine ROS  Renal/GU negative Renal ROS  negative genitourinary   Musculoskeletal negative musculoskeletal ROS (+)   Abdominal   Peds negative pediatric ROS (+)  Hematology negative hematology ROS (+)   Anesthesia Other Findings   Reproductive/Obstetrics negative OB ROS                           Anesthesia Physical Anesthesia Plan  ASA: I  Anesthesia Plan: General   Post-op Pain Management:    Induction: Intravenous  Airway Management Planned: Oral ETT  Additional Equipment:   Intra-op Plan:   Post-operative Plan: Extubation in OR  Informed Consent: I have reviewed the patients History and Physical, chart, labs and discussed the procedure including the risks, benefits and alternatives for the proposed anesthesia with the patient or authorized representative who has indicated his/her understanding and acceptance.   Dental advisory given  Plan Discussed with: CRNA  Anesthesia Plan Comments:         Anesthesia Quick Evaluation

## 2012-04-12 NOTE — Transfer of Care (Signed)
Immediate Anesthesia Transfer of Care Note  Patient: [name]  Procedure(s) Performed: Procedure(s) with comments: LUMBAR LAMINECTOMY/DECOMPRESSION MICRODISCECTOMY 1 LEVEL (N/A) - MICRO LUMBAR DECOMPRESSION L4-5  Patient Location: PACU  Anesthesia Type:General  Level of Consciousness: awake, alert , oriented and patient cooperative  Airway & Oxygen Therapy: Patient Spontanous Breathing and Patient connected to face mask oxygen  Post-op Assessment: Report given to PACU RN and Post -op Vital signs reviewed and stable  Post vital signs: Reviewed and stable  Complications: No apparent anesthesia complications

## 2012-04-12 NOTE — H&P (Signed)
[name] is an 47 y.o. female.   Chief Complaint: back and left leg pain HPI: 46y/o female c/o 1.5 yrs ongoing LBP and severe L leg pain without injury. Leg pain worse than back pain. Pain radiates L buttock, posterior thigh, posterior lower leg, dorsal/lateral foot. Symptoms refractory to chiropractic tx, PT, SNRB left L5, ESI L4-5, pain medication.  Past Medical History  Diagnosis Date  . Anemia     none recent    Past Surgical History  Procedure Laterality Date  . Abdominal hysterectomy  2009  . Cesarean section  1997 and 2001    History reviewed. No pertinent family history. Social History:  reports that she quit smoking about 18 years ago. Her smoking use included Cigarettes. She smoked 0.00 packs per day for 10 years. She has never used smokeless tobacco. She reports that  drinks alcohol. Her drug history is not on file.  Allergies: No Known Allergies  Medications Prior to Admission  Medication Sig Dispense Refill  . acetaminophen (TYLENOL) 500 MG chewable tablet Chew 1,000 mg by mouth every 6 (six) hours as needed for pain.      . Estradiol (DIVIGEL) 0.5 MG/0.5GM GEL Place 0.5 mg onto the skin daily. Apply to inner thigh      . OVER THE COUNTER MEDICATION Take 1 tablet by mouth daily. Calcium, magnesium, boron, vitamin d      . oxyCODONE-acetaminophen (PERCOCET/ROXICET) 5-325 MG per tablet Take 1 tablet by mouth every 4 (four) hours as needed for pain.      Marland Kitchen ibuprofen (ADVIL,MOTRIN) 200 MG tablet Take 800 mg by mouth every 6 (six) hours as needed for pain.        No results found for this or any previous visit (from the past 48 hour(s)). No results found.  Review of Systems  Constitutional: Negative.   HENT: Negative.   Eyes: Negative.   Respiratory: Negative.   Cardiovascular: Negative.   Gastrointestinal: Negative.   Genitourinary: Negative.   Musculoskeletal: Positive for back pain.  Skin: Negative.   Neurological: Negative.     Blood pressure 109/71,  pulse 75, temperature 97.1 F (36.2 C), temperature source Oral, resp. rate 16, SpO2 100.00%. Physical Exam  Constitutional: She is oriented to person, place, and time. She appears well-developed and well-nourished.  HENT:  Head: Normocephalic and atraumatic.  Eyes: EOM are normal. Pupils are equal, round, and reactive to light.  Neck: Normal range of motion. Neck supple.  Cardiovascular: Normal rate and regular rhythm.   Respiratory: Effort normal and breath sounds normal.  GI: Soft. Bowel sounds are normal.  Musculoskeletal:  On exam moderate distress. Straight leg raise produces buttock, thigh, and calf pain on the left exacerbated with dorsal augmentation maneuver, negative on the right. Trace EHL weakness is noted. Hyperreflexic. No Babinski or clonus. No instability hip, knees, or ankles.  Neurological: She is alert and oriented to person, place, and time.  Skin: Skin is warm and dry.     MRI from 09/05/11 withparacentral disc herniation, lateral recess stenosis noted at L4-5 with focal disc herniation pressing upon the left L5 nerve root. Moderate facet hypertrophy. There is moderate facet hypertrophy at L5-S1. No nerve root effacement. 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   Assessment/Plan HNP L4-5, L5 radiculopathy, refractory  Plan for microlumbar decompression L4-5. Dr. Shelle Iron discussed risks, complications, alternatives with the pt including but not limited to DVT, PE, infx, bleeding, failure  of procedure, need for secondary procedure, CSF leak, dural tear, nerve injury, anesthesia risk, even death. Pt questions answered, desires to proceed.  Chavez, Frances M. 04/12/2012, 1:15 PM

## 2012-04-12 NOTE — Anesthesia Postprocedure Evaluation (Signed)
Anesthesia Post Note  Patient: [name]  Procedure(s) Performed: Procedure(s) (LRB): LUMBAR LAMINECTOMY/DECOMPRESSION MICRODISCECTOMY 1 LEVEL (N/A)  Anesthesia type: General  Patient location: PACU  Post pain: Pain level controlled  Post assessment: Post-op Vital signs reviewed  Last Vitals:  Filed Vitals:   04/12/12 1745  BP: 121/53  Pulse: 74  Temp: 36.8 C  Resp: 15    Post vital signs: Reviewed  Level of consciousness: sedated  Complications: No apparent anesthesia complications

## 2012-04-12 NOTE — Brief Op Note (Signed)
04/12/2012  4:54 PM  PATIENT:  [name]  47 y.o. female  PRE-OPERATIVE DIAGNOSIS:  H&P AND STENOSIS L4-5  POST-OPERATIVE DIAGNOSIS:  H&P and stenosis l4-5  PROCEDURE:  Procedure(s) with comments: LUMBAR LAMINECTOMY/DECOMPRESSION MICRODISCECTOMY 1 LEVEL (N/A) - MICRO LUMBAR DECOMPRESSION L4-5  SURGEON:  Surgeon(s) and Role:    * Javier Docker, MD - Primary  PHYSICIAN ASSISTANT:   ASSISTANTS: Bissell   ANESTHESIA:   general  EBL:  Total I/O In: 1000 [I.V.:1000] Out: 50 [Blood:50]  BLOOD ADMINISTERED:none  DRAINS: none   LOCAL MEDICATIONS USED:  MARCAINE     SPECIMEN:  Source of Specimen:  L45  DISPOSITION OF SPECIMEN:  PATHOLOGY  COUNTS:  YES  TOURNIQUET:  * No tourniquets in log * Dictation number : [id number]  PLAN OF CARE: Admit for overnight observation  PATIENT DISPOSITION:  PACU - hemodynamically stable.   Delay start of Pharmacological VTE agent (>24hrs) due to surgical blood loss or risk of bleeding: yes

## 2012-04-13 ENCOUNTER — Encounter (HOSPITAL_COMMUNITY): Payer: Self-pay | Admitting: Specialist

## 2012-04-13 LAB — CBC
MCHC: 35.2 g/dL (ref 30.0–36.0)
Platelets: 272 10*3/uL (ref 150–400)
RDW: 12.4 % (ref 11.5–15.5)

## 2012-04-13 LAB — BASIC METABOLIC PANEL
GFR calc Af Amer: >90 mL/min (ref >90)
GFR calc non Af Amer: >90 mL/min (ref >90)
Potassium: 3.4 mEq/L — ABNORMAL LOW (ref 3.5–5.1)
Sodium: 135 mEq/L (ref 135–145)

## 2012-04-13 MED ORDER — ONDANSETRON HCL 4 MG PO TABS: 4.0000 mg | ORAL_TABLET | Freq: Three times a day (TID) | ORAL | Status: DC | PRN

## 2012-04-13 NOTE — Evaluation (Signed)
Physical Therapy Evaluation Patient Details Name: [name] MRN: [id number] DOB: 1965/10/31 Today's Date: 04/13/2012 Time: 4098-1191 PT Time Calculation (min): 20 min  PT Assessment / Plan / Recommendation Clinical Impression  Pt s/p lumbar lami presents with functional mobility limitations associated with post op pain and back precautions.  Pt currently able to mobilitze without assist and demonstrating good adherence to back precautions    PT Assessment  Patient needs continued PT services    Follow Up Recommendations  No PT follow up    Does the patient have the potential to tolerate intense rehabilitation      Barriers to Discharge None      Equipment Recommendations  None recommended by PT    Recommendations for Other Services     Frequency      Precautions / Restrictions Precautions Precautions: Back Restrictions Weight Bearing Restrictions: No   Pertinent Vitals/Pain Min c/o pain: premed      Mobility  Bed Mobility Bed Mobility: Rolling Left;Left Sidelying to Sit Rolling Left: 5: Supervision;With rail Left Sidelying to Sit: 5: Supervision;6: Modified independent (Device/Increase time);HOB flat Details for Bed Mobility Assistance: Pt demonstrates correct log roll technique Transfers Transfers: Sit to Stand;Stand to Sit Sit to Stand: 5: Supervision;From bed;From chair/3-in-1;Without upper extremity assist Stand to Sit: 5: Supervision;To bed;To chair/3-in-1;Without upper extremity assist Details for Transfer Assistance: VC's for maintaining back precautions, positioning Ambulation/Gait Ambulation/Gait Assistance: 5: Supervision;7: Independent Ambulation Distance (Feet): 500 Feet Assistive device: Rolling walker Ambulation/Gait Assistance Details: min cues for pace and back precautions Gait Pattern: Within Functional Limits Stairs: Yes Stairs Assistance: 5: Supervision Stair Management Technique: One rail Left;Forwards Number of Stairs: 5     Exercises     PT Diagnosis:    PT Problem List: Pain;Decreased knowledge of precautions PT Treatment Interventions: Gait training;Stair training;Patient/family education;Functional mobility training   PT Goals    Visit Information  Last PT Received On: 04/13/12 Assistance Needed: +1    Subjective Data  Subjective: I am a new woman, I feel so much better than before surgery Patient Stated Goal: Resume previous active lifestyle with decreased pain   Prior Functioning  Home Living Lives With: Spouse;Other (Comment) Available Help at Discharge: Family Type of Home: House Home Access: Stairs to enter [employer] of Steps: 4-5 Entrance Stairs-Rails: Left Home Layout: One level;Able to live on main level with bedroom/bathroom Bathroom Shower/Tub: Tub/shower unit;Walk-in shower Bathroom Toilet: Handicapped height Bathroom Accessibility: Yes How Accessible: Accessible via walker Home Adaptive Equipment: Built-in shower seat Prior Function Level of Independence: Independent Able to Take Stairs?: Yes Driving: Yes Vocation: Full time employment Communication Communication: No difficulties Dominant Hand: Right    Cognition  Cognition Overall Cognitive Status: Appears within functional limits for tasks assessed/performed Arousal/Alertness: Awake/alert Orientation Level: Appears intact for tasks assessed Behavior During Session: [facility] for tasks performed    Extremity/Trunk Assessment Right Upper Extremity Assessment RUE ROM/Strength/Tone: WFL for tasks assessed RUE Coordination: WFL - gross/fine motor Left Upper Extremity Assessment LUE ROM/Strength/Tone: WFL for tasks assessed LUE Coordination: WFL - gross/fine motor Right Lower Extremity Assessment RLE ROM/Strength/Tone: WFL for tasks assessed Left Lower Extremity Assessment LLE ROM/Strength/Tone: WFL for tasks assessed Trunk Assessment Trunk Assessment: Normal   Balance Balance Balance Assessed: Yes Static  Sitting Balance Static Sitting - Balance Support: Feet supported Static Sitting - Level of Assistance: 7: Independent Static Standing Balance Static Standing - Balance Support: No upper extremity supported;During functional activity Static Standing - Level of Assistance: 6: Modified independent (Device/Increase time)  End  of Session PT - End of Session Activity Tolerance: Patient tolerated treatment well Patient left: in bed;with call bell/phone within reach Nurse Communication: Mobility status  GP Functional Assessment Tool Used: clinical judgement Functional Limitation: Mobility: Walking and moving around Mobility: Walking and Moving Around Current Status ([id number]): At least 1 percent but less than 20 percent impaired, limited or restricted Mobility: Walking and Moving Around Goal Status [phone]): At least 1 percent but less than 20 percent impaired, limited or restricted Mobility: Walking and Moving Around Discharge Status [phone]): At least 1 percent but less than 20 percent impaired, limited or restricted   [facility] 04/13/2012, 10:56 AM

## 2012-04-13 NOTE — Care Management Note (Signed)
    Page 1 of 1   04/13/2012     12:53:58 PM   CARE MANAGEMENT NOTE 04/13/2012  Patient:  [name], [name]   Account Number:  [id number]  Date Initiated:  04/13/2012  Documentation initiated by:  Colleen Can  Subjective/Objective Assessment:   DX hnp, stenosis L4-5; decompressive lumbar laminectomy     Action/Plan:   CM spoke with patient. Plans are for patient to return to her home in [facility] where spouse will be caregiver. She will not need DME or HH services.   Anticipated DC Date:  04/13/2012   Anticipated DC Plan:  HOME/SELF CARE      DC Planning Services  CM consult      Choice offered to / List presented to:             Status of service:  Completed, signed off Medicare Important Message given?   (If response is "NO", the following Medicare IM given date fields will be blank) Date Medicare IM given:   Date Additional Medicare IM given:    Discharge Disposition:  HOME/SELF CARE  Per UR Regulation:  Reviewed for med. necessity/level of care/duration of stay  If discussed at Long Length of Stay Meetings, dates discussed:    Comments:

## 2012-04-13 NOTE — Evaluation (Signed)
Occupational Therapy Evaluation Patient Details Name: SHEINA MCLEISH MRN: [id number] DOB: [birth year] Today's Date: 04/13/2012 Time: 2130-8657 OT Time Calculation (min): 20 min  OT Assessment / Plan / Recommendation Clinical Impression  Pt is a 47 y/o female s/p L4-L5 lumbar laminectomy/decompression. She plans to d/c home w/ family/spouse assist PRN. Pt demonstrates overall supervision-Mod I level transfers to toilet, bed mobility & crossing 1 leg over knee for LB bathe/dressing. She states that family can assist PRN. Handout reviewed & issued w/ pt for back precautions. No further OT needs, will sign off.    OT Assessment  Patient does not need any further OT services    Follow Up Recommendations  No OT follow up    Barriers to Discharge      Equipment Recommendations  Other (comment);None recommended by OT (Pt reports high toilet at home, states no DME needs)    Recommendations for Other Services    Frequency       Precautions / Restrictions Precautions Precautions: Back Restrictions Weight Bearing Restrictions: No   Pertinent Vitals/Pain 3/10 low back pain. Pt reports that she had taken medication prior to assessment.    ADL  Eating/Feeding: Simulated;Independent Where Assessed - Eating/Feeding: Bed level Grooming: Performed;Modified independent;Supervision/safety Where Assessed - Grooming: Unsupported standing Upper Body Bathing: Simulated;Set up;Supervision/safety Where Assessed - Upper Body Bathing: Unsupported sitting;Unsupported standing Lower Body Bathing: Simulated;Supervision/safety Where Assessed - Lower Body Bathing: Unsupported sit to stand Upper Body Dressing: Performed;Supervision/safety;Modified independent (Sitting crossing one leg over knee to don/doff socks) Where Assessed - Upper Body Dressing: Unsupported sitting Lower Body Dressing: Performed;Other (comment) (Sitting crossing one leg over knee to don/doff socks) Where Assessed - Lower Body Dressing:  Unsupported sitting;Unsupported sit to stand Toilet Transfer: Research scientist (life sciences) Method: Sit to Barista: Comfort height toilet Toileting - Clothing Manipulation and Hygiene: Supervision/safety;Modified independent;Simulated Where Assessed - Engineer, mining and Hygiene: Sit on 3-in-1 or toilet Tub/Shower Transfer: Other (comment) (Reviewed verbally/handout Back Precautions reviewed) Tub/Shower Transfer Method: Not assessed Transfers/Ambulation Related to ADLs: Pt overall Mod I-supervision functional mobility in room, bathroom for ADL's and self care tasks. Bed mobility at supervision level after reviewing w/ pt, HOB flat, log roll. ADL Comments: Issued handout for back precautions and reviewed w/ pt in room. Discussed bathing and dressing techniques as well as home making etc. Pt reports that spouse and 3 sons (school age will assist PRN at D/C). Plans to d/c home, no further needs at this time.    OT Diagnosis:    OT Problem List:   OT Treatment Interventions:     OT Goals    Visit Information  Last OT Received On: 04/13/12    Subjective Data  Subjective: Pt reports that she plans to d/c home later today w/ family/spouse PRN assist Patient Stated Goal: Return home as able   Prior Functioning     Home Living Lives With: Spouse;Other (Comment) (3 sons school age) Available Help at Discharge: Family Type of Home: House Home Access: Stairs to enter [employer] of Steps: 4-5 Entrance Stairs-Rails: Left Home Layout: One level;Able to live on main level with bedroom/bathroom Bathroom Shower/Tub: Tub/shower unit;Walk-in shower Bathroom Toilet: Handicapped height Bathroom Accessibility: Yes How Accessible: Accessible via walker Home Adaptive Equipment: Built-in shower seat Prior Function Level of Independence: Independent Able to Take Stairs?: Yes Driving: Yes 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) Communication Communication: No difficulties Dominant Hand: Right    Vision/Perception Vision - History Baseline Vision: Wears  glasses only for reading Patient Visual Report: No change from baseline   Cognition  Cognition Overall Cognitive Status: Appears within functional limits for tasks assessed/performed Arousal/Alertness: Awake/alert Orientation Level: Appears intact for tasks assessed Behavior During Session: [facility] for tasks performed    Extremity/Trunk Assessment Right Upper Extremity Assessment RUE ROM/Strength/Tone: WFL for tasks assessed RUE Coordination: WFL - gross/fine motor Left Upper Extremity Assessment LUE ROM/Strength/Tone: WFL for tasks assessed LUE Coordination: WFL - gross/fine motor     Mobility Bed Mobility Bed Mobility: Rolling Left;Left Sidelying to Sit Rolling Left: 5: Supervision;With rail Left Sidelying to Sit: 5: Supervision;6: Modified independent (Device/Increase time);HOB flat Transfers Transfers: Sit to Stand;Stand to Sit Sit to Stand: 5: Supervision;From bed;From chair/3-in-1;Without upper extremity assist Stand to Sit: 5: Supervision;To bed;To chair/3-in-1;Without upper extremity assist Details for Transfer Assistance: VC's for maintaining back precautions, positioning       Balance Balance Balance Assessed: Yes Static Sitting Balance Static Sitting - Balance Support: Feet supported Static Sitting - Level of Assistance: 7: Independent Static Standing Balance Static Standing - Balance Support: No upper extremity supported;During functional activity Static Standing - Level of Assistance: 6: Modified independent (Device/Increase time)   End of Session OT - End of Session Activity Tolerance: Patient tolerated treatment well Patient left: in bed;with call bell/phone within reach  GO Functional Assessment Tool Used: Clinical judgement Functional Limitation: Self care Self Care Current Status (Z6109): At  least 1 percent but less than 20 percent impaired, limited or restricted Self Care Goal Status (U0454): At least 1 percent but less than 20 percent impaired, limited or restricted Self Care Discharge Status [phone]): At least 1 percent but less than 20 percent impaired, limited or restricted   Alm Bustard 04/13/2012, 9:21 AM

## 2012-04-13 NOTE — Progress Notes (Signed)
CSW received referral for possible SNF placement. CSW reviewed chart and noted that PT/OT recommended No PT/OT follow up needed. Pt discharged to home today with no needs. No social work needs identified. CSW signed off.  Jacklynn Lewis, MSW, LCSWA (coverage for Cori Razor, [place]) Clinical Social Work [phone]

## 2012-04-13 NOTE — Progress Notes (Signed)
Patient discharged to home.  Reviewed discharge instructions with patient and spouse.  No further questions at this time.  IV removed from left wrist.  Patient escorted off unit by spouse.  Patient sent home with prescriptions.  Patient discharged.

## 2012-04-13 NOTE — Progress Notes (Signed)
Subjective: 1 Day Post-Op Procedure(s) (LRB): LUMBAR LAMINECTOMY/DECOMPRESSION MICRODISCECTOMY 1 LEVEL (N/A) Patient reports pain as mild.  Notes mild incisional back pain. No leg pain. No numbness or tingling. Voiding without difficulty. Has been OOB to bathroom several times since surgery without difficulty. Did have 2 episodes of nausea overnight but no vomiting, improved today.  Objective: Vital signs in last 24 hours: Temp:  [97.1 F (36.2 C)-98.6 F (37 C)] 98.6 F (37 C) (03/11 0625) Pulse Rate:  [59-85] 84 (03/11 0625) Resp:  [10-18] 18 (03/11 0625) BP: (95-127)/(48-76) 100/60 mmHg (03/11 0625) SpO2:  [99 %-100 %] 99 % (03/11 0625) Weight:  [69.174 kg (152 lb 8 oz)] 69.174 kg (152 lb 8 oz) (03/10 2043)  Intake/Output from previous day: 03/10 0701 - 03/11 0700 In: 3867 [P.O.:1042; I.V.:2825] Out: 1825 [Urine:1775; Blood:50] Intake/Output this shift:    No results found for this basename: HGB,  in the last 72 hours No results found for this basename: WBC, RBC, HCT, PLT,  in the last 72 hours No results found for this basename: NA, K, CL, CO2, BUN, CREATININE, GLUCOSE, CALCIUM,  in the last 72 hours No results found for this basename: LABPT, INR,  in the last 72 hours  Neurologically intact Neurovascular intact Sensation intact distally Intact pulses distally Dorsiflexion/Plantar flexion intact Incision: dressing C/D/I No cellulitis present Compartment soft No calf pain or sign of DVT Aquacel dressing in place  Assessment/Plan: 1 Day Post-Op Procedure(s) (LRB): LUMBAR LAMINECTOMY/DECOMPRESSION MICRODISCECTOMY 1 LEVEL (N/A) Advance diet Up with therapy D/C IV fluids D/C home today Reviewed D/C instructions Will discuss with Dr. Elissa Lovett, Dayna Barker. 04/13/2012, 7:17 AM

## 2012-04-14 NOTE — Op Note (Signed)
[name], [name]                ACCOUNT NO.:  [id number]  MEDICAL RECORD NO.:  [id number]  LOCATION:  1613                         FACILITY:  [facility]  PHYSICIAN:  Jene Every, M.D.    DATE OF BIRTH:  12/25/1965  DATE OF PROCEDURE:  04/12/2012 DATE OF DISCHARGE:  04/13/2012                              OPERATIVE REPORT   PREOPERATIVE DIAGNOSIS:  Spinal stenosis, herniated nucleus pulposus at L4-5, left.  POSTOPERATIVE DIAGNOSIS:  Spinal stenosis, herniated nucleus pulposus at L4-5, left.  PROCEDURES PERFORMED: 1. Microlumbar decompression at L4-5, left. 2. Foraminotomies at L4-L5. 3. Retrieval of multiple large fragments.  ANESTHESIA:  General.  ASSISTANT:  Lanna Poche, PA.  HISTORY:  This is a 47 year old, disk herniation, mild terminal weakness, dermatomal dysesthesia secondary to disk herniation, requesting conservative treatment.  No neurologic deficit, failing conservative treatment.  She was indicated for decompression at 4-5. She had some lateral recess stenosis as well, indicated for decompression.  Risk and benefits were discussed including bleeding, infection, damage to neurovascular structure, DVT, PE, anesthetic complications, etc.  Also need for fusion in the future.  TECHNIQUE:  With the patient in supine position, after induction of adequate general anesthesia, and 2 g of Kefzol, she was placed prone on the [place] frame.  All bony prominences were well padded.  Lumbar region was prepped and draped in usual sterile fashion.  Two 18-gauge spinal needle was utilized to localize the 4-5 interspace and confirmed with x-ray.  Incision was made from spinous process at 4-5. Subcutaneous tissue was dissected.  Electrocautery was utilized to achieve hemostasis.  Dorsolumbar fascia was identified and divided in line with skin incision.  Paraspinous muscle was elevated from lamina of 4 and 5.  Operating microscope was draped, brought on the surgical field.   Confirmatory radiograph obtained.  Next, hemilaminotomy of the caudad edge of 4 was performed with 2-mm Kerrison.  We detached the ligamentum flavum from cephalad edge of 5 with straight curette and microcurette.  We removed the ligamentum flavum from the interspace. Severe compression of the 5 root.  No evidence of lateral recess.  We therefore performed a generous foraminotomy preserving the pars and then we complete effacement of the 5 root.  We gently mobilized the 5 root medially and decompressed the lateral recess to the medial border of the pedicle with 2-mm Kerrison.  I used a micro nerve hook and to retrieve multiple fragments from beneath the nerve root.  This was done meticulously without tension on the root.  Multiple large fragments were retrieved and sent to Pathology.  We entered the disk space and removed multiple fragments as well.  We checked beneath the thecal sac and retrieved the fragment there.  Performed foraminotomy of L4 root and stenosis.  We entered the disk space with the straight pituitary as well.  Following the decompression with 1 cm excursion on the 5 root medial to the pedicle without tension.  There was edema noted and the root was intact.  There was no disk herniations in the 4 or 5 foramen beneath thecal sac, the shoulder, axilla, or the root.  Next, I copiously irrigated the disk space with antibiotic irrigation. Inspection revealed no  evidence of CSF leaks or active bleeding.  We then used bipolar electrocautery utilized to achieve hemostasis.  Next, we removed the [facility] retractor and inspection revealed no evidence of active bleeding.  We repaired the dorsolumbar fascia with 1 Vicryl simple sutures, subcu with 2-0 Vicryl, skin was reapproximated with 4-0 subcuticular Prolene.  Wound reinforced with Steri-Strips.  Sterile dressing was applied.  Placed supine on the hospital bed, extubated without difficulty, and transported to the recovery room  in satisfactory condition.  The patient tolerated the procedure well.  No complications.  Assistant, Lanna Poche, [place], who was helpful to use of operating microscope suction, intermittent nerve root protection and traction.     Jene Every, M.D.     Cordelia Pen  D:  04/13/2012  T:  04/14/2012  Job:  [id number]

## 2012-04-16 NOTE — Discharge Summary (Signed)
Physician Discharge Summary   Patient ID: [name] MRN: [id number] DOB/AGE: [birth year] 47 y.o.  Admit date: 04/12/2012 Discharge date: 04/16/2012  Primary Diagnosis:   H&P AND STENOSIS L4-5  Admission Diagnoses:  Past Medical History  Diagnosis Date  . Anemia     none recent   Discharge Diagnoses:   Active Problems:   * No active hospital problems. *  Procedure:  Procedure(s) (LRB): LUMBAR LAMINECTOMY/DECOMPRESSION MICRODISCECTOMY 1 LEVEL (N/A)   Consults: None  HPI:  see H&P    Laboratory Data: Hospital Outpatient Visit on 04/06/2012  Component Date Value Range Status  . Sodium 04/06/2012 138  135 - 145 mEq/L Final  . Potassium 04/06/2012 3.8  3.5 - 5.1 mEq/L Final  . Chloride 04/06/2012 102  96 - 112 mEq/L Final  . CO2 04/06/2012 25  19 - 32 mEq/L Final  . Glucose, Bld 04/06/2012 103* 70 - 99 mg/dL Final  . BUN 40/98/1191 14  6 - 23 mg/dL Final  . Creatinine, Ser 04/06/2012 0.71  0.50 - 1.10 mg/dL Final  . Calcium 47/82/9562 9.2  8.4 - 10.5 mg/dL Final  . GFR calc non Af Amer 04/06/2012 >90  >90 mL/min Final  . GFR calc Af Amer 04/06/2012 >90  >90 mL/min Final   Comment:                                 The eGFR has been calculated                          using the CKD EPI equation.                          This calculation has not been                          validated in all clinical                          situations.                          eGFR's persistently                          <90 mL/min signify                          possible Chronic Kidney Disease.  . WBC 04/06/2012 6.4  4.0 - 10.5 K/uL Final  . RBC 04/06/2012 4.54  3.87 - 5.11 MIL/uL Final  . Hemoglobin 04/06/2012 14.0  12.0 - 15.0 g/dL Final  . HCT 13/09/6576 40.2  36.0 - 46.0 % Final  . MCV 04/06/2012 88.5  78.0 - 100.0 fL Final  . MCH 04/06/2012 30.8  26.0 - 34.0 pg Final  . MCHC 04/06/2012 34.8  30.0 - 36.0 g/dL Final  . RDW 46/96/2952 12.6  11.5 - 15.5 % Final  . Platelets  04/06/2012 249  150 - 400 K/uL Final  . MRSA, PCR 04/06/2012 NEGATIVE  NEGATIVE Final  . Staphylococcus aureus 04/06/2012 NEGATIVE  NEGATIVE Final   Comment:  The Xpert SA Assay (FDA                          approved for NASAL specimens                          in patients over 55 years of age),                          is one component of                          a comprehensive surveillance                          program.  Test performance has                          been validated by [employer] for patients greater                          than or equal to 46 year old.                          It is not intended                          to diagnose infection nor to                          guide or monitor treatment.   No results found for this basename: HGB,  in the last 72 hours No results found for this basename: WBC, RBC, HCT, PLT,  in the last 72 hours No results found for this basename: NA, K, CL, CO2, BUN, CREATININE, GLUCOSE, CALCIUM,  in the last 72 hours No results found for this basename: LABPT, INR,  in the last 72 hours  X-Rays:Dg Lumbar Spine 2-3 Views  04/06/2012  *RADIOLOGY REPORT*  Clinical Data: Low back pain, preop evaluation  LUMBAR SPINE - 2-3 VIEW  Comparison: None available  Findings: Five lumbar type vertebral bodies.  Normal alignment. Preserved vertebral body heights.  No compression fracture or wedge- shaped deformity.  No definite pars defects by plain radiography. Normal SI joints.  Nonobstructive bowel gas pattern.  Pedicles appear intact.  IMPRESSION: No acute finding or malalignment by plain radiography.   Original Report Authenticated By: Judie Petit. Miles Costain, M.D.    Dg Spine Portable 1 View  04/12/2012  *RADIOLOGY REPORT*  Clinical Data: L4-5 surgery.  PORTABLE SPINE - 1 VIEW  Comparison: Previous examinations, including earlier today.  Findings: Metallic localizer with its tip overlying the posterior  aspect of the L4-5 disc space.  IMPRESSION: Localizer at the L4-5 level.   Original Report Authenticated By: Beckie Salts, M.D.    Dg Spine Portable 1 View  04/12/2012  *RADIOLOGY REPORT*  Clinical Data: L4-5 surgery.  PORTABLE SPINE - 1 VIEW  Comparison: Earlier today and lumbar spine dated 04/06/2012.  Findings: For counting purposes, the last open disc space is again labeled the L5-S1 level.  Metallic localizer with its tip projected posterior to the facets at the mid L5 level.  IMPRESSION: Localizer at the L5 level.   Original Report Authenticated By: Beckie Salts, M.D.    Dg Spine Portable 1 View  04/12/2012  *RADIOLOGY REPORT*  Clinical Data: Lumbar surgery.  PORTABLE SPINE - 1 VIEW  Comparison: 04/06/2012.  Findings: There are two surgical probes in place posterior to L4 and L5.  The appearance of the lumbar spine is unchanged compared to the recent preoperative examination.  IMPRESSION: 1.  Intraoperative localization of L4 and L5, as above.   Original Report Authenticated By: Trudie Reed, M.D.     EKG:No orders found for this or any previous visit.   Hospital Course: Patient was admitted to [facility] and taken to the OR and underwent the above state procedure without complications.  Patient tolerated the procedure well and was later transferred to the recovery room and then to the orthopaedic floor for postoperative care.  They were given PO and IV analgesics for pain control following their surgery.  They were given 24 hours of postoperative antibiotics.   PT was consulted postop to assist with mobility and transfers.  The patient was allowed to be WBAT with therapy and was taught back precautions. Discharge planning was consulted to help with postop disposition and equipment needs.  Patient had a good night on the evening of surgery and started to get up OOB with therapy on day one. Patient was seen in rounds and was ready to go home on day one.  They were given discharge  instructions and dressing directions.  They were instructed on when to follow up in the office with Dr. Shelle Iron.  Discharge Medications: Prior to Admission medications   Medication Sig Start Date End Date Taking? Authorizing Provider  acetaminophen (TYLENOL) 500 MG chewable tablet Chew 1,000 mg by mouth every 6 (six) hours as needed for pain.   Yes Historical Provider, MD  Estradiol (DIVIGEL) 0.5 MG/0.5GM GEL Place 0.5 mg onto the skin daily. Apply to inner thigh   Yes Historical Provider, MD  OVER THE COUNTER MEDICATION Take 1 tablet by mouth daily. Calcium, magnesium, boron, vitamin d   Yes Historical Provider, MD  ibuprofen (ADVIL,MOTRIN) 200 MG tablet Take 800 mg by mouth every 6 (six) hours as needed for pain.    Historical Provider, MD  methocarbamol (ROBAXIN) 500 MG tablet Take 1 tablet (500 mg total) by mouth 4 (four) times daily. 04/12/12   Dayna Barker. Bissell, PA-C  ondansetron (ZOFRAN) 4 MG tablet Take 1 tablet (4 mg total) by mouth every 8 (eight) hours as needed for nausea. 04/13/12   Dayna Barker. Bissell, PA-C  oxyCODONE-acetaminophen (PERCOCET) 7.5-325 MG per tablet Take 1-2 tablets by mouth every 4 (four) hours as needed for pain. 04/12/12   Dayna Barker. Christene Lye, PA-C    Diet: Regular diet Activity:WBAT Follow-up:in 10-14 days Disposition - Home Discharged Condition: good   Discharge Orders   Future Orders Complete By Expires     Call MD / Call 911  As directed     Comments:      If you experience chest pain or shortness of breath, CALL 911 and be transported to the hospital emergency room.  If you develope a fever above 101 F, pus (white drainage) or increased drainage or redness at the wound, or calf pain, call your surgeon's office.    Constipation Prevention  As directed     Comments:      Drink  plenty of fluids.  Prune juice may be helpful.  You may use a stool softener, such as Colace (over the counter) 100 mg twice a day.  Use MiraLax (over the counter) for constipation as  needed.    Diet - low sodium heart healthy  As directed     Increase activity slowly as tolerated  As directed         Medication List    STOP taking these medications       oxyCODONE-acetaminophen 5-325 MG per tablet  Commonly known as:  PERCOCET/ROXICET      TAKE these medications       acetaminophen 500 MG chewable tablet  Commonly known as:  TYLENOL  Chew 1,000 mg by mouth every 6 (six) hours as needed for pain.     DIVIGEL 0.5 MG/0.5GM Gel  Generic drug:  Estradiol  Place 0.5 mg onto the skin daily. Apply to inner thigh     ibuprofen 200 MG tablet  Commonly known as:  ADVIL,MOTRIN  Take 800 mg by mouth every 6 (six) hours as needed for pain.     methocarbamol 500 MG tablet  Commonly known as:  ROBAXIN  Take 1 tablet (500 mg total) by mouth 4 (four) times daily.     ondansetron 4 MG tablet  Commonly known as:  ZOFRAN  Take 1 tablet (4 mg total) by mouth every 8 (eight) hours as needed for nausea.     OVER THE COUNTER MEDICATION  Take 1 tablet by mouth daily. Calcium, magnesium, boron, vitamin d     oxyCODONE-acetaminophen 7.5-325 MG per tablet  Commonly known as:  PERCOCET  Take 1-2 tablets by mouth every 4 (four) hours as needed for pain.           Follow-up Information   Follow up with BEANE,JEFFREY C, MD In 10 days.   Contact information:   [address] [place] [address] [phone]       Signed: Dorothy Spark. 04/16/2012, 11:52 AM

## 2012-12-09 ENCOUNTER — Other Ambulatory Visit: Payer: Self-pay

## 2015-09-28 ENCOUNTER — Emergency Department (HOSPITAL_BASED_OUTPATIENT_CLINIC_OR_DEPARTMENT_OTHER)
Admission: EM | Admit: 2015-09-28 | Discharge: 2015-09-28 | Disposition: A | Discharge status: Home / Self Care | Payer: BLUE CROSS/BLUE SHIELD | Attending: Emergency Medicine | Admitting: Emergency Medicine

## 2015-09-28 ENCOUNTER — Emergency Department (HOSPITAL_BASED_OUTPATIENT_CLINIC_OR_DEPARTMENT_OTHER): Payer: BLUE CROSS/BLUE SHIELD

## 2015-09-28 ENCOUNTER — Encounter (HOSPITAL_BASED_OUTPATIENT_CLINIC_OR_DEPARTMENT_OTHER): Payer: Self-pay | Admitting: *Deleted

## 2015-09-28 DIAGNOSIS — N83201 Unspecified ovarian cyst, right side: Secondary | ICD-10-CM | POA: Diagnosis not present

## 2015-09-28 DIAGNOSIS — Z87891 Personal history of nicotine dependence: Secondary | ICD-10-CM | POA: Diagnosis not present

## 2015-09-28 DIAGNOSIS — Z79899 Other long term (current) drug therapy: Secondary | ICD-10-CM | POA: Diagnosis not present

## 2015-09-28 DIAGNOSIS — Z791 Long term (current) use of non-steroidal anti-inflammatories (NSAID): Secondary | ICD-10-CM | POA: Insufficient documentation

## 2015-09-28 DIAGNOSIS — R1030 Lower abdominal pain, unspecified: Secondary | ICD-10-CM | POA: Diagnosis present

## 2015-09-28 LAB — CBC WITH DIFFERENTIAL/PLATELET
Basophils Absolute: 0 10*3/uL (ref 0.0–0.1)
Basophils Relative: 0 %
Eosinophils Absolute: 0.1 10*3/uL (ref 0.0–0.7)
Eosinophils Relative: 2 %
HCT: 38.9 % (ref 36.0–46.0)
HEMOGLOBIN: 13.2 g/dL (ref 12.0–15.0)
LYMPHS ABS: 2.2 10*3/uL (ref 0.7–4.0)
LYMPHS PCT: 29 %
MCH: 30.6 pg (ref 26.0–34.0)
MCHC: 33.9 g/dL (ref 30.0–36.0)
MCV: 90 fL (ref 78.0–100.0)
Monocytes Absolute: 0.5 10*3/uL (ref 0.1–1.0)
Monocytes Relative: 7 %
NEUTROS PCT: 62 %
Neutro Abs: 4.8 10*3/uL (ref 1.7–7.7)
Platelets: 262 10*3/uL (ref 150–400)
RBC: 4.32 MIL/uL (ref 3.87–5.11)
RDW: 13 % (ref 11.5–15.5)
WBC: 7.7 10*3/uL (ref 4.0–10.5)

## 2015-09-28 LAB — URINALYSIS, ROUTINE W REFLEX MICROSCOPIC
BILIRUBIN URINE: NEGATIVE
GLUCOSE, UA: NEGATIVE mg/dL
Hgb urine dipstick: NEGATIVE
Ketones, ur: NEGATIVE mg/dL
NITRITE: POSITIVE — AB
PH: 7 (ref 5.0–8.0)
Protein, ur: NEGATIVE mg/dL
SPECIFIC GRAVITY, URINE: 1.012 (ref 1.005–1.030)

## 2015-09-28 LAB — COMPREHENSIVE METABOLIC PANEL
ALT: 20 U/L (ref 14–54)
ANION GAP: 6 (ref 5–15)
AST: 19 U/L (ref 15–41)
Albumin: 4.5 g/dL (ref 3.5–5.0)
Alkaline Phosphatase: 72 U/L (ref 38–126)
BUN: 10 mg/dL (ref 6–20)
CHLORIDE: 107 mmol/L (ref 101–111)
CO2: 24 mmol/L (ref 22–32)
Calcium: 8.9 mg/dL (ref 8.9–10.3)
Creatinine, Ser: 0.65 mg/dL (ref 0.44–1.00)
GFR calc non Af Amer: >60 mL/min (ref >60)
Glucose, Bld: 88 mg/dL (ref 65–99)
Potassium: 3.6 mmol/L (ref 3.5–5.1)
SODIUM: 137 mmol/L (ref 135–145)
Total Bilirubin: 0.3 mg/dL (ref 0.3–1.2)
Total Protein: 7.1 g/dL (ref 6.5–8.1)

## 2015-09-28 LAB — URINE MICROSCOPIC-ADD ON

## 2015-09-28 LAB — LIPASE, BLOOD: Lipase: 36 U/L (ref 11–51)

## 2015-09-28 MED ORDER — HYDROCODONE-ACETAMINOPHEN 5-325 MG PO TABS
2.0000 | ORAL_TABLET | Freq: Once | ORAL | Status: AC
  Administered 2015-09-28: 2 via ORAL
  Filled 2015-09-28: qty 2

## 2015-09-28 MED ORDER — HYDROCODONE-ACETAMINOPHEN 5-325 MG PO TABS: 1.0000 | ORAL_TABLET | ORAL | 0 refills | Status: AC | PRN

## 2015-09-28 MED ORDER — SODIUM CHLORIDE 0.9 % IV BOLUS (SEPSIS)
500.0000 mL | Freq: Once | INTRAVENOUS | Status: AC
  Administered 2015-09-28: 500 mL via INTRAVENOUS

## 2015-09-28 MED ORDER — IOPAMIDOL (ISOVUE-300) INJECTION 61%
100.0000 mL | Freq: Once | INTRAVENOUS | Status: AC | PRN
  Administered 2015-09-28: 100 mL via INTRAVENOUS

## 2015-09-28 MED ORDER — IBUPROFEN 800 MG PO TABS: 800.0000 mg | ORAL_TABLET | Freq: Three times a day (TID) | ORAL | 0 refills | Status: AC

## 2015-09-28 MED ORDER — IBUPROFEN 800 MG PO TABS
800.0000 mg | ORAL_TABLET | Freq: Once | ORAL | Status: AC
  Administered 2015-09-28: 800 mg via ORAL
  Filled 2015-09-28: qty 1

## 2015-09-28 NOTE — ED Provider Notes (Signed)
[facility]-EMERGENCY DEPT Provider Note   CSN: [id number] Arrival date & time: 09/28/15  1615  By signing my name below, I, Soijett Blue, attest that this documentation has been prepared under the direction and in the presence of Arby BarretteMarcy Jasmon Mattice, MD. Electronically Signed: Soijett Blue, ED Scribe. 09/28/15. 6:14 PM.   History   Chief Complaint Chief Complaint  Patient presents with  . Abdominal Pain    HPI  [name] is a 50 y.o. female who presents to the Emergency Department complaining of sharp, stabbing, lower abdominal pain onset 2:30 AM. Pt notes that her severe abdominal pain lasted for 2 hours this morning and then gradually decreased in intensity. Pt reports that [place] and she was unable to stand straight up due to the pain. Pt denies having a diagnosed kidney stone in the past, but has had two episodes of abdominal pain in the past two years that she contributed to possible kidney stones. Pt states that she has had a hysterectomy with a left salpingectomy and that her current pain feels mildly similar to when she had a ruptured ovary in the past. She states that she is having associated symptoms of dysuria. She states that she has tried AZO with no relief for her symptoms. She denies vomiting, nausea, diarrhea, CP, SOB, cough, and any other symptoms. Pt states that she had melanoma surgery to her thigh, that was completed 2 months ago.    The history is provided by the patient. No language interpreter was used.    Past Medical History:  Diagnosis Date  . Anemia    none recent    There are no active problems to display for this patient.   Past Surgical History:  Procedure Laterality Date  . ABDOMINAL HYSTERECTOMY  2009  . CESAREAN SECTION  1997 and 2001  . LUMBAR LAMINECTOMY/DECOMPRESSION MICRODISCECTOMY N/A 04/12/2012   Procedure: LUMBAR LAMINECTOMY/DECOMPRESSION MICRODISCECTOMY 1 LEVEL;  Surgeon: Javier DockerJeffrey C Beane, MD;  Location:  [facility] ORS;  Service: Orthopedics;  Laterality: N/A;  MICRO LUMBAR DECOMPRESSION L4-5    OB History    No data available       Home Medications    Prior to Admission medications   Medication Sig Start Date End Date Taking? Authorizing Provider  acetaminophen (TYLENOL) 500 MG chewable tablet Chew 1,000 mg by mouth every 6 (six) hours as needed for pain.   Yes Historical Provider, MD  ibuprofen (ADVIL,MOTRIN) 200 MG tablet Take 800 mg by mouth every 6 (six) hours as needed for pain.   Yes Historical Provider, MD  OVER THE COUNTER MEDICATION Take 1 tablet by mouth daily. Calcium, magnesium, boron, vitamin d   Yes Historical Provider, MD  HYDROcodone-acetaminophen (NORCO/VICODIN) 5-325 MG tablet Take 1-2 tablets by mouth every 4 (four) hours as needed for moderate pain or severe pain. 09/28/15   Arby BarretteMarcy Satina Jerrell, MD  ibuprofen (ADVIL,MOTRIN) 800 MG tablet Take 1 tablet (800 mg total) by mouth 3 (three) times daily. 09/28/15   Arby BarretteMarcy Kebron Pulse, MD  methocarbamol (ROBAXIN) 500 MG tablet Take 1 tablet (500 mg total) by mouth 4 (four) times daily. 04/12/12   Dorothy SparkJaclyn M Bissell, PA-C    Family History No family history on file.  Social History Social History  Substance Use Topics  . Smoking status: Former Smoker    Years: 10.00    Types: Cigarettes    Quit date: 02/03/1994  . Smokeless tobacco: Never Used  . Alcohol use Yes  Comment: occasional wine,social smoker only     Allergies   Review of patient's allergies indicates no known allergies.   Review of Systems Review of Systems  Respiratory: Negative for cough and shortness of breath.   Cardiovascular: Negative for chest pain.  Gastrointestinal: Positive for abdominal pain. Negative for diarrhea, nausea and vomiting.  Genitourinary: Positive for dysuria.     Physical Exam Updated Vital Signs BP 118/63 (BP Location: Left Arm)   Pulse 74   Temp 97.8 F (36.6 C)   Resp 16   Ht 5\' 8"  (1.727 m)   Wt 152 lb (68.9 kg)   SpO2 100%    BMI 23.11 kg/m   Physical Exam  Constitutional: She is oriented to person, place, and time. She appears well-developed and well-nourished. No distress.  HENT:  Head: Normocephalic and atraumatic.  Eyes: EOM are normal.  Neck: Neck supple.  Cardiovascular: Normal rate, regular rhythm and normal heart sounds.  Exam reveals no gallop and no friction rub.   No murmur heard. Symmetrical femoral pulses.  Pulmonary/Chest: Effort normal and breath sounds normal. No respiratory distress. She has no wheezes. She has no rales.  Abdominal: Soft. She exhibits no distension. There is generalized tenderness. There is no guarding.  No CVA tenderness. Generalized abdominal pain without guarding.   Musculoskeletal: Normal range of motion.  Neurological: She is alert and oriented to person, place, and time.  Skin: Skin is warm and dry.  Psychiatric: She has a normal mood and affect. Her behavior is normal.  Nursing note and vitals reviewed.    ED Treatments / Results  DIAGNOSTIC STUDIES: Oxygen Saturation is 99% on RA, nl by my interpretation.    COORDINATION OF CARE: 6:10 PM Discussed treatment plan with pt at bedside which includes UA, labs, and CT abdomen pelvis, and pt agreed to plan.   Labs (all labs ordered are listed, but only abnormal results are displayed) Labs Reviewed  URINE CULTURE - Abnormal; Notable for the following:       Result Value   Culture   (*)    Value: 50,000 COLONIES/mL LACTOBACILLUS SPECIES Standardized susceptibility testing for this organism is not available. Performed at [facility]    All other components within normal limits  URINALYSIS, ROUTINE W REFLEX MICROSCOPIC (NOT AT [facility]) - Abnormal; Notable for the following:    Color, Urine ORANGE (*)    APPearance CLOUDY (*)    Nitrite POSITIVE (*)    Leukocytes, UA TRACE (*)    All other components within normal limits  URINE MICROSCOPIC-ADD ON - Abnormal; Notable for the following:    Squamous  Epithelial / LPF 0-5 (*)    Bacteria, UA FEW (*)    All other components within normal limits  COMPREHENSIVE METABOLIC PANEL  LIPASE, BLOOD  CBC WITH DIFFERENTIAL/PLATELET    EKG  EKG Interpretation None       Radiology No results found.  Procedures Procedures (including critical care time)  Medications Ordered in ED Medications  sodium chloride 0.9 % bolus 500 mL (0 mLs Intravenous Stopped 09/28/15 2315)  iopamidol (ISOVUE-300) 61 % injection 100 mL (100 mLs Intravenous Contrast Given 09/28/15 2111)  ibuprofen (ADVIL,MOTRIN) tablet 800 mg (800 mg Oral Given 09/28/15 2242)  HYDROcodone-acetaminophen (NORCO/VICODIN) 5-325 MG per tablet 2 tablet (2 tablets Oral Given 09/28/15 2242)     Initial Impression / Assessment and Plan / ED Course  I have reviewed the triage vital signs and the nursing notes.  Pertinent labs & imaging results  that were available during my care of the patient were reviewed by me and considered in my medical decision making (see chart for details).  Clinical Course    Final Clinical Impressions(s) / ED Diagnoses   Final diagnoses:  Cyst of right ovary    New Prescriptions Discharge Medication List as of 09/28/2015 10:35 PM    START taking these medications   Details  HYDROcodone-acetaminophen (NORCO/VICODIN) 5-325 MG tablet Take 1-2 tablets by mouth every 4 (four) hours as needed for moderate pain or severe pain., Starting Fri 09/28/2015, Print    !! ibuprofen (ADVIL,MOTRIN) 800 MG tablet Take 1 tablet (800 mg total) by mouth 3 (three) times daily., Starting Fri 09/28/2015, Print     !! - Potential duplicate medications found. Please discuss with provider.          Arby Barrette, MD 10/23/15 2019

## 2015-09-28 NOTE — ED Notes (Signed)
Two attempts at lab draw Left hand unsuccessful and and Left wrist successful  CBC and CMP Lipase obtained

## 2015-09-28 NOTE — ED Notes (Addendum)
Pt reports pain x 15 hours above hip and deep in lower abdomen. Pt states it reminds her of when she had a ruptured ovarian cyst.

## 2015-09-28 NOTE — ED Triage Notes (Addendum)
C/o abd pain that woke her at 0230  C/o right lower abd pain across lower abd and down bil legs. No n/v or fever.C/o having difficulty  Urinating and has burning on urination.

## 2015-09-30 LAB — URINE CULTURE

## 2018-02-27 IMAGING — CT CT ABD-PELV W/ CM
2 of 5 series · 16 of 46 positions shown, 18 images · IV contrast (APPLIED)
Comparison: None.

CLINICAL DATA: Sharp RIGHT lower quadrant pain. Melanoma surgery by
2 months prior. Prior back surgery

EXAM:
CT ABDOMEN AND PELVIS WITH CONTRAST
TECHNIQUE: Multidetector CT imaging of the abdomen and pelvis was performed
using the standard protocol following bolus administration of
intravenous contrast.
CONTRAST:  100mL L9F6M1-SUU IOPAMIDOL (L9F6M1-[id number]) INJECTION 61%

[Series 2: axial st · axial · 0.87mm/px · z∈[-480,-75]mm · 13 of 93 slices shown, 15 images]
[im 6/93  soft-tissue]
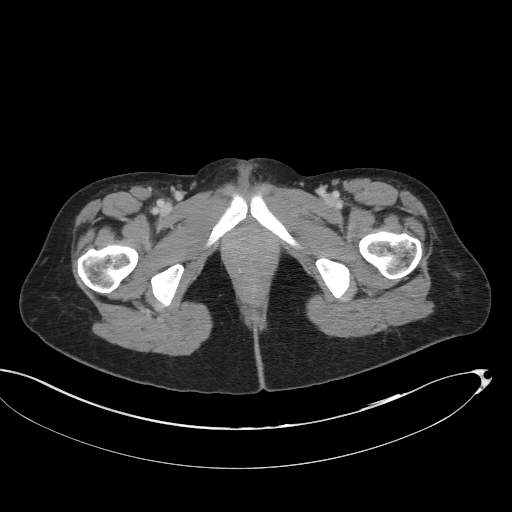
[im 6/93  bone]
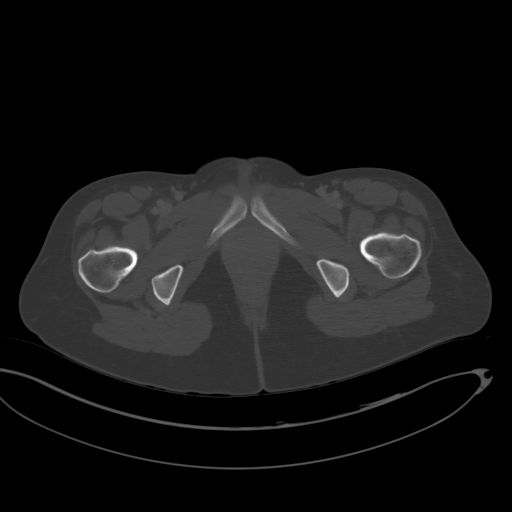
[im 11/93  soft-tissue]
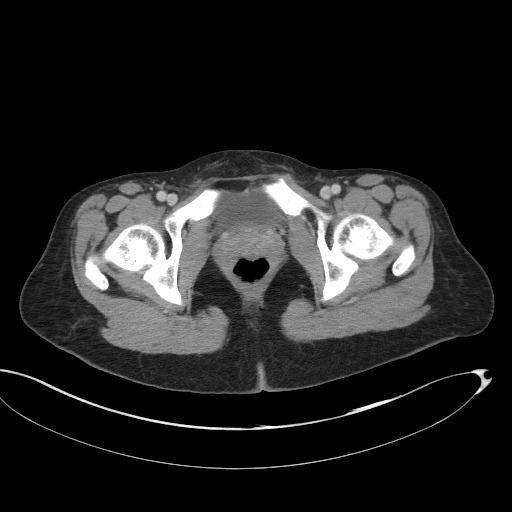
[im 21/93  soft-tissue]
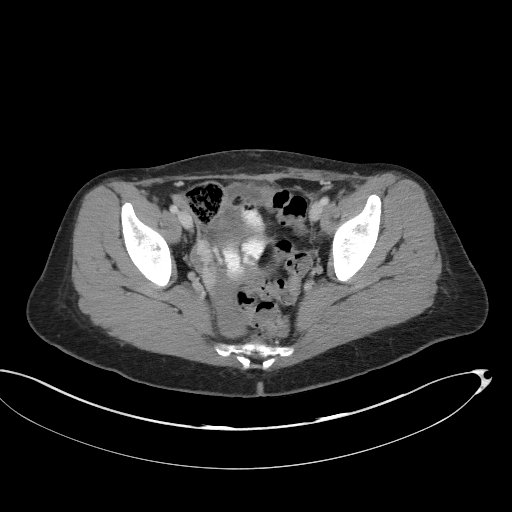
[im 26/93  soft-tissue]
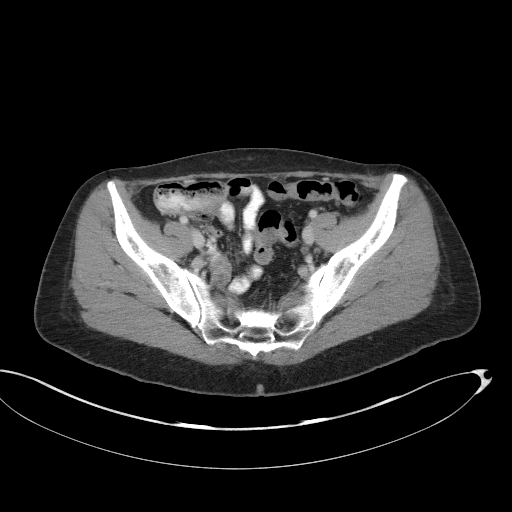
[im 31/93  soft-tissue]
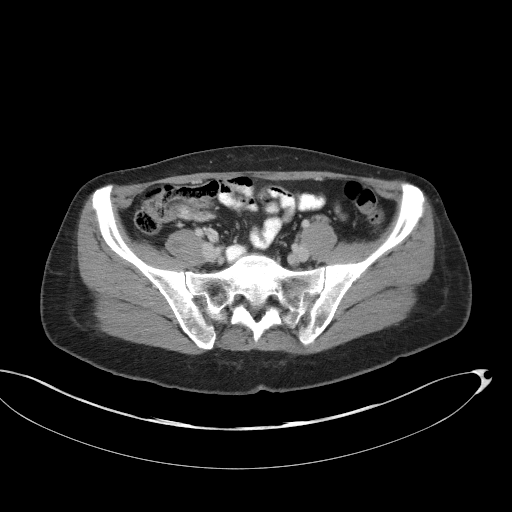
[im 41/93  soft-tissue]
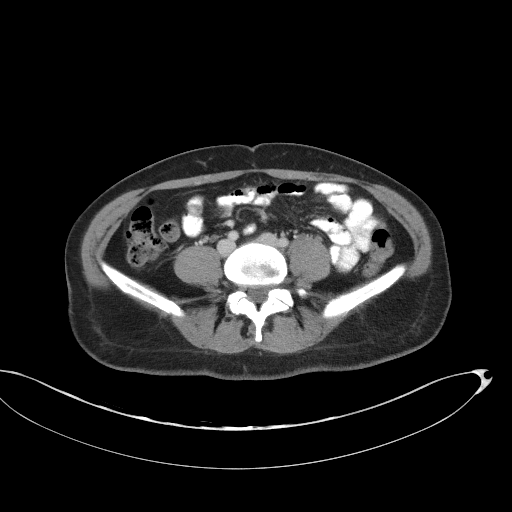
[im 47/93  soft-tissue]
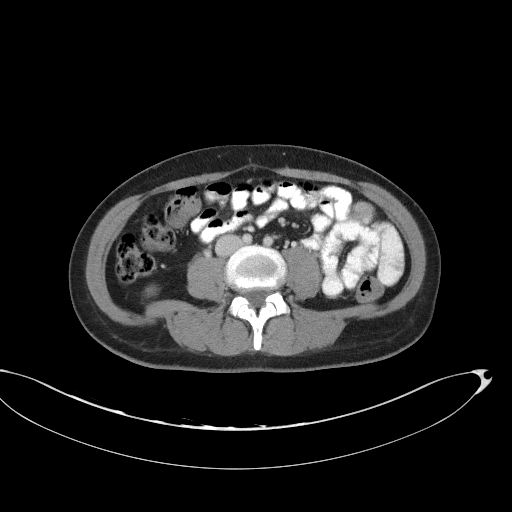
[im 52/93  soft-tissue]
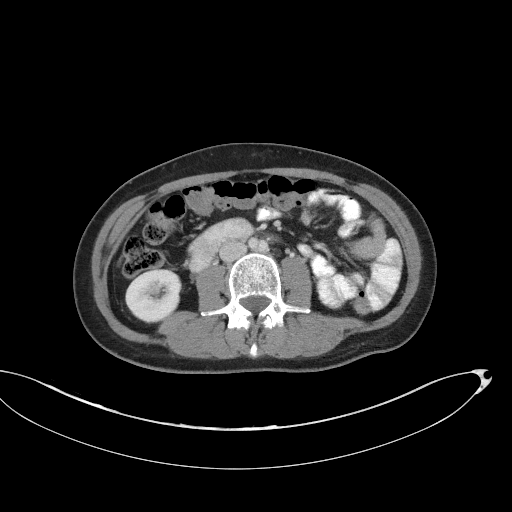
[im 62/93  soft-tissue]
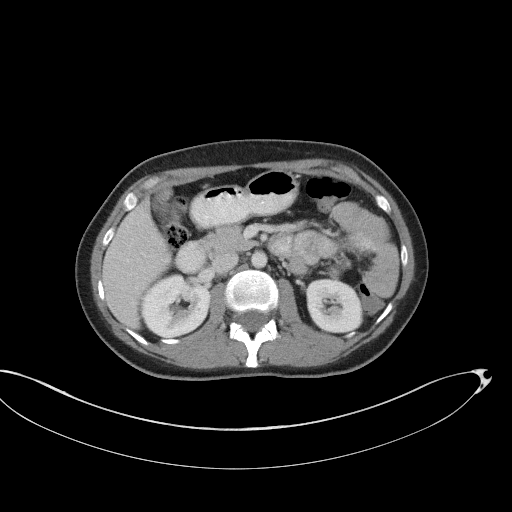
[im 62/93  bone]
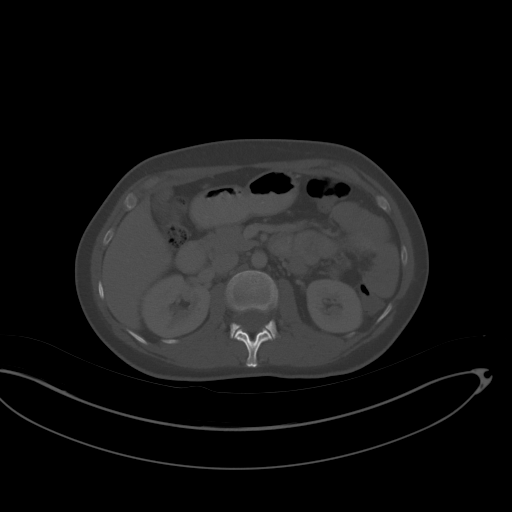
[im 67/93  soft-tissue]
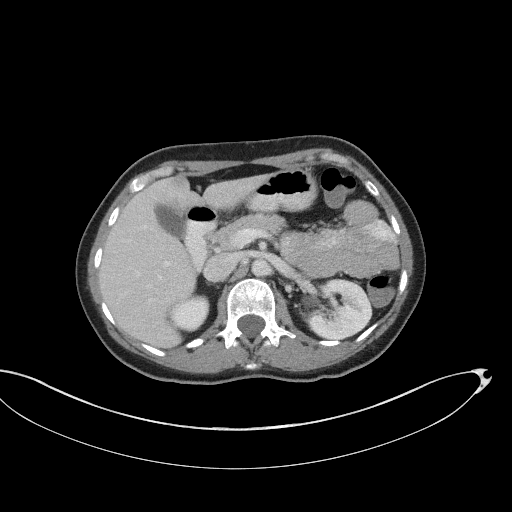
[im 72/93  soft-tissue]
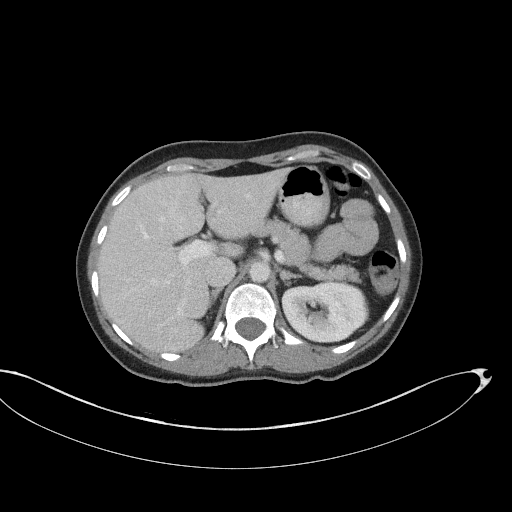
[im 82/93  soft-tissue]
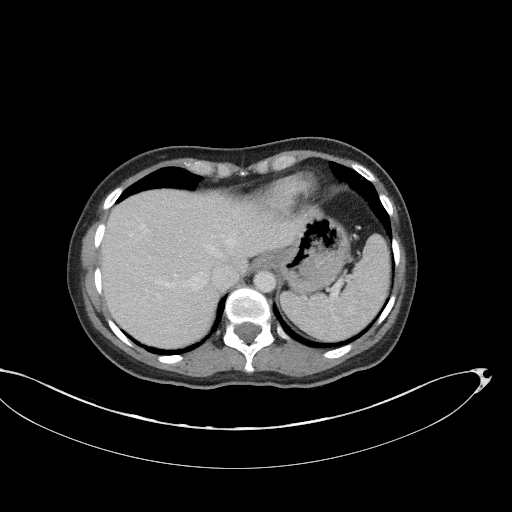
[im 87/93  soft-tissue]
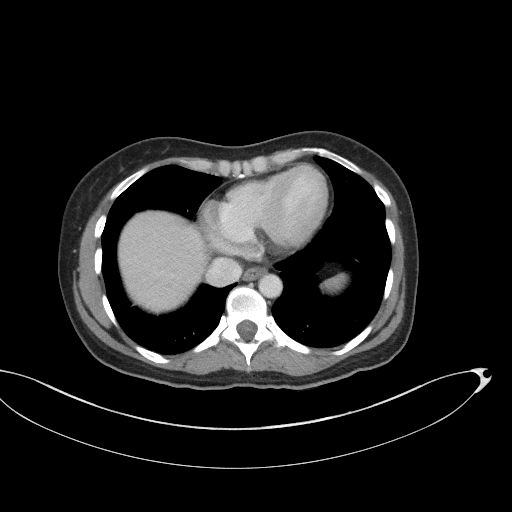

[Series 5: coronal st · coronal · 0.77mm/px · 3 of 66 slices shown]
[im 22/66  soft-tissue]
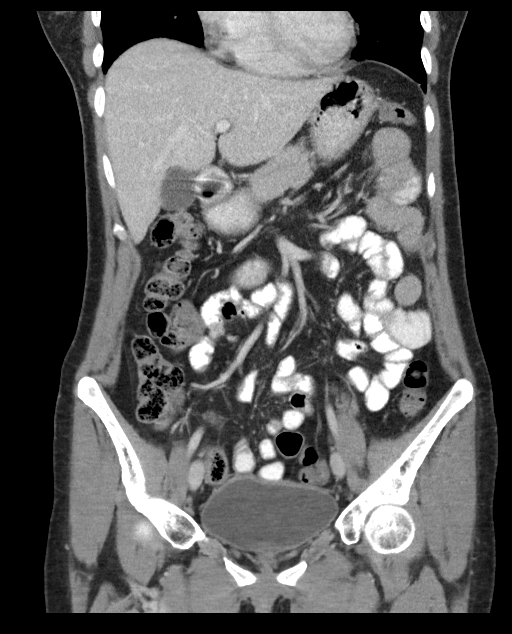
[im 29/66  soft-tissue]
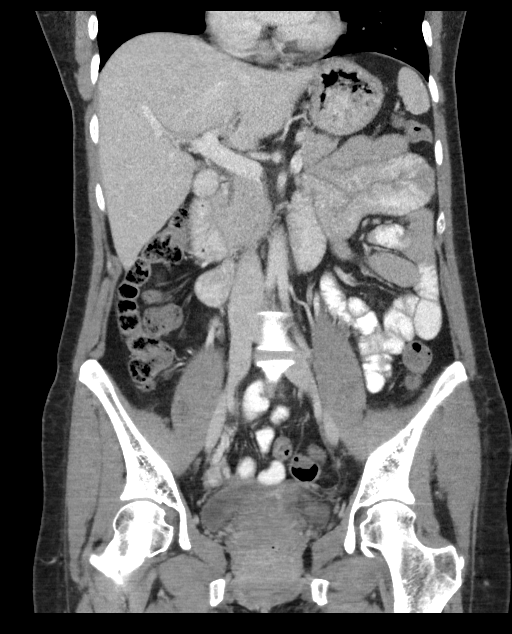
[im 37/66  soft-tissue]
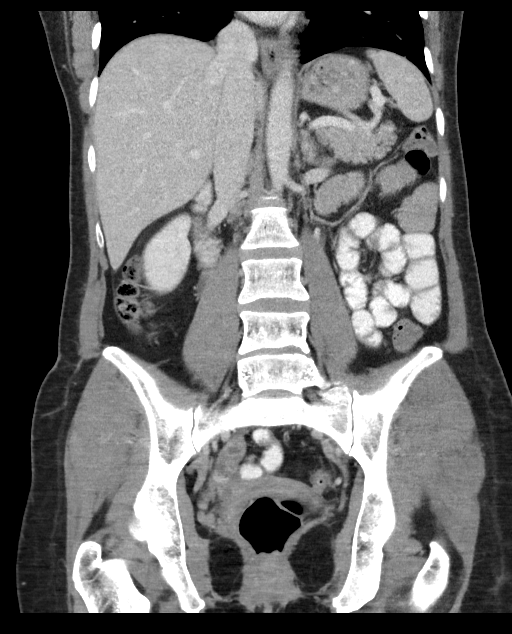

[16 of 46 positions shown; findings below may reference images not displayed]

FINDINGS: Lower chest: Lung bases are clear.

Hepatobiliary: Low-density lesion along the falciform ligament
consistent fatty infiltration. Normal gallbladder.

Pancreas: Pancreas is normal. No ductal dilatation. No pancreatic
inflammation.

Spleen: Normal spleen

Adrenals/urinary tract: Adrenal glands and kidneys are normal. The
ureters and bladder normal.

Stomach/Bowel: Stomach, small bowel, appendix, and cecum are normal.
Appendix is partially imaged appears normal (image 71, series 2 The
colon and rectosigmoid colon are normal.

Vascular/Lymphatic: Abdominal aorta is normal caliber. There is no
retroperitoneal or periportal lymphadenopathy. No pelvic
lymphadenopathy.

Reproductive: Post hysterectomy anatomy.  Ovaries appear normal.

Other: There is a small volume of high-density fluid in the
posterior RIGHT cul-de-sac (image 74, series 2). This is favored
represent a fluid from a ruptured ovarian cysts.

Musculoskeletal: No aggressive osseous lesion.
IMPRESSION: 1. High-density fluid within the posterior RIGHT pelvis is favored
to represent hemorrhage from a ruptured ovarian cyst.
2. LEFT and RIGHT ovary appear normal.
3. Appendix is partially imaged and appears normal.
4. No obstructive uropathy.
